# Patient Record
Sex: Female | Born: 1940 | Race: Black or African American | Hispanic: No | Marital: Married | Smoking: Former smoker
Health system: Southern US, Community
[De-identification: ages and names within clinical notes are randomized; demographics above are authoritative.]

## PROBLEM LIST (undated history)

## (undated) DIAGNOSIS — M1990 Unspecified osteoarthritis, unspecified site: Secondary | ICD-10-CM

## (undated) DIAGNOSIS — J42 Unspecified chronic bronchitis: Secondary | ICD-10-CM

## (undated) DIAGNOSIS — I10 Essential (primary) hypertension: Secondary | ICD-10-CM

## (undated) DIAGNOSIS — Z972 Presence of dental prosthetic device (complete) (partial): Secondary | ICD-10-CM

## (undated) DIAGNOSIS — N289 Disorder of kidney and ureter, unspecified: Secondary | ICD-10-CM

## (undated) DIAGNOSIS — M810 Age-related osteoporosis without current pathological fracture: Secondary | ICD-10-CM

## (undated) DIAGNOSIS — J449 Chronic obstructive pulmonary disease, unspecified: Secondary | ICD-10-CM

## (undated) DIAGNOSIS — IMO0001 Reserved for inherently not codable concepts without codable children: Secondary | ICD-10-CM

## (undated) DIAGNOSIS — K219 Gastro-esophageal reflux disease without esophagitis: Secondary | ICD-10-CM

## (undated) DIAGNOSIS — K5790 Diverticulosis of intestine, part unspecified, without perforation or abscess without bleeding: Secondary | ICD-10-CM

---

## 2007-03-13 ENCOUNTER — Ambulatory Visit: Payer: Self-pay | Admitting: Internal Medicine

## 2007-06-14 ENCOUNTER — Ambulatory Visit: Payer: Self-pay | Admitting: Family Medicine

## 2007-06-17 ENCOUNTER — Ambulatory Visit: Payer: Self-pay | Admitting: Internal Medicine

## 2008-10-30 ENCOUNTER — Emergency Department: Payer: Self-pay | Admitting: Emergency Medicine

## 2009-02-28 ENCOUNTER — Ambulatory Visit: Payer: Self-pay | Admitting: Family Medicine

## 2009-03-23 ENCOUNTER — Inpatient Hospital Stay: Payer: Self-pay | Admitting: Student

## 2010-05-03 ENCOUNTER — Ambulatory Visit: Payer: Self-pay | Admitting: Family Medicine

## 2011-05-04 ENCOUNTER — Ambulatory Visit: Payer: Self-pay | Admitting: Family Medicine

## 2011-05-16 DIAGNOSIS — I10 Essential (primary) hypertension: Secondary | ICD-10-CM | POA: Insufficient documentation

## 2012-02-26 ENCOUNTER — Ambulatory Visit: Payer: Self-pay | Admitting: Gastroenterology

## 2012-02-27 LAB — PATHOLOGY REPORT

## 2012-05-06 ENCOUNTER — Ambulatory Visit: Payer: Self-pay | Admitting: Family Medicine

## 2013-04-13 ENCOUNTER — Ambulatory Visit: Payer: Self-pay | Admitting: Family Medicine

## 2013-05-07 ENCOUNTER — Ambulatory Visit: Payer: Self-pay | Admitting: Family Medicine

## 2013-08-31 DIAGNOSIS — E559 Vitamin D deficiency, unspecified: Secondary | ICD-10-CM | POA: Insufficient documentation

## 2013-11-27 DIAGNOSIS — E7800 Pure hypercholesterolemia, unspecified: Secondary | ICD-10-CM | POA: Insufficient documentation

## 2014-03-22 DIAGNOSIS — J449 Chronic obstructive pulmonary disease, unspecified: Secondary | ICD-10-CM | POA: Insufficient documentation

## 2014-05-12 ENCOUNTER — Ambulatory Visit
Admit: 2014-05-12 | Disposition: A | Discharge status: Home / Self Care | Payer: Self-pay | Attending: Family Medicine | Admitting: Family Medicine

## 2014-05-27 ENCOUNTER — Encounter: Payer: Self-pay | Admitting: *Deleted

## 2014-06-02 ENCOUNTER — Ambulatory Visit: Payer: Medicare HMO | Admitting: Anesthesiology

## 2014-06-02 ENCOUNTER — Encounter
Admission: RE | Disposition: A | Discharge status: Home / Self Care | Payer: Self-pay | Source: Ambulatory Visit | Attending: Ophthalmology

## 2014-06-02 ENCOUNTER — Ambulatory Visit
Admission: RE | Admit: 2014-06-02 | Discharge: 2014-06-02 | Disposition: A | Discharge status: Home / Self Care | Payer: Medicare HMO | Source: Ambulatory Visit | Attending: Ophthalmology | Admitting: Ophthalmology

## 2014-06-02 ENCOUNTER — Encounter: Payer: Self-pay | Admitting: Anesthesiology

## 2014-06-02 DIAGNOSIS — K219 Gastro-esophageal reflux disease without esophagitis: Secondary | ICD-10-CM | POA: Insufficient documentation

## 2014-06-02 DIAGNOSIS — K5790 Diverticulosis of intestine, part unspecified, without perforation or abscess without bleeding: Secondary | ICD-10-CM | POA: Diagnosis not present

## 2014-06-02 DIAGNOSIS — Z87891 Personal history of nicotine dependence: Secondary | ICD-10-CM | POA: Diagnosis not present

## 2014-06-02 DIAGNOSIS — M810 Age-related osteoporosis without current pathological fracture: Secondary | ICD-10-CM | POA: Diagnosis not present

## 2014-06-02 DIAGNOSIS — J449 Chronic obstructive pulmonary disease, unspecified: Secondary | ICD-10-CM | POA: Insufficient documentation

## 2014-06-02 DIAGNOSIS — I10 Essential (primary) hypertension: Secondary | ICD-10-CM | POA: Diagnosis not present

## 2014-06-02 DIAGNOSIS — N289 Disorder of kidney and ureter, unspecified: Secondary | ICD-10-CM | POA: Insufficient documentation

## 2014-06-02 DIAGNOSIS — M1990 Unspecified osteoarthritis, unspecified site: Secondary | ICD-10-CM | POA: Insufficient documentation

## 2014-06-02 DIAGNOSIS — M7989 Other specified soft tissue disorders: Secondary | ICD-10-CM | POA: Insufficient documentation

## 2014-06-02 DIAGNOSIS — Z7951 Long term (current) use of inhaled steroids: Secondary | ICD-10-CM | POA: Insufficient documentation

## 2014-06-02 DIAGNOSIS — R062 Wheezing: Secondary | ICD-10-CM | POA: Diagnosis not present

## 2014-06-02 DIAGNOSIS — H2512 Age-related nuclear cataract, left eye: Secondary | ICD-10-CM | POA: Diagnosis present

## 2014-06-02 HISTORY — DX: Presence of dental prosthetic device (complete) (partial): Z97.2

## 2014-06-02 HISTORY — DX: Gastro-esophageal reflux disease without esophagitis: K21.9

## 2014-06-02 HISTORY — DX: Unspecified osteoarthritis, unspecified site: M19.90

## 2014-06-02 HISTORY — DX: Diverticulosis of intestine, part unspecified, without perforation or abscess without bleeding: K57.90

## 2014-06-02 HISTORY — DX: Age-related osteoporosis without current pathological fracture: M81.0

## 2014-06-02 HISTORY — DX: Chronic obstructive pulmonary disease, unspecified: J44.9

## 2014-06-02 HISTORY — DX: Reserved for inherently not codable concepts without codable children: IMO0001

## 2014-06-02 HISTORY — DX: Disorder of kidney and ureter, unspecified: N28.9

## 2014-06-02 HISTORY — PX: CATARACT EXTRACTION W/PHACO: SHX586

## 2014-06-02 SURGERY — PHACOEMULSIFICATION, CATARACT, WITH IOL INSERTION
Anesthesia: Monitor Anesthesia Care | Laterality: Left | Wound class: Clean

## 2014-06-02 MED ORDER — ACETAMINOPHEN 160 MG/5ML PO SOLN: 325.0000 mg | ORAL | Status: DC | PRN

## 2014-06-02 MED ORDER — ACETAMINOPHEN 325 MG PO TABS: 325.0000 mg | ORAL_TABLET | ORAL | Status: DC | PRN

## 2014-06-02 MED ORDER — EPINEPHRINE HCL 1 MG/ML IJ SOLN
INTRAMUSCULAR | Status: DC | PRN
  Administered 2014-06-02: 1 mL

## 2014-06-02 MED ORDER — TETRACAINE HCL 0.5 % OP SOLN
1.0000 [drp] | Freq: Once | OPHTHALMIC | Status: AC
  Administered 2014-06-02: 1 [drp] via OPHTHALMIC

## 2014-06-02 MED ORDER — MIDAZOLAM HCL 2 MG/2ML IJ SOLN
INTRAMUSCULAR | Status: DC | PRN
  Administered 2014-06-02: 2 mg via INTRAVENOUS

## 2014-06-02 MED ORDER — NA HYALUR & NA CHOND-NA HYALUR 0.4-0.35 ML IO KIT
PACK | INTRAOCULAR | Status: DC | PRN
  Administered 2014-06-02: 1 mL via INTRAOCULAR

## 2014-06-02 MED ORDER — POVIDONE-IODINE 5 % OP SOLN
1.0000 | Freq: Once | OPHTHALMIC | Status: AC
Start: 2014-06-02 — End: 2014-06-02
  Administered 2014-06-02: 1 via OPHTHALMIC

## 2014-06-02 MED ORDER — BSS IO SOLN
INTRAOCULAR | Status: DC | PRN
  Administered 2014-06-02: 88 mL via INTRAOCULAR

## 2014-06-02 MED ORDER — FENTANYL CITRATE (PF) 100 MCG/2ML IJ SOLN
INTRAMUSCULAR | Status: DC | PRN
  Administered 2014-06-02: 50 ug via INTRAVENOUS

## 2014-06-02 MED ORDER — ARMC OPHTHALMIC DILATING GEL
1.0000 "application " | OPHTHALMIC | Status: DC | PRN
  Administered 2014-06-02 (×2): 1 via OPHTHALMIC

## 2014-06-02 MED ORDER — BRIMONIDINE TARTRATE 0.2 % OP SOLN
OPHTHALMIC | Status: DC | PRN
  Administered 2014-06-02: 3 [drp] via OPHTHALMIC

## 2014-06-02 MED ORDER — CEFUROXIME OPHTHALMIC INJECTION 1 MG/0.1 ML
INJECTION | OPHTHALMIC | Status: DC | PRN
  Administered 2014-06-02: .3 mL via OPHTHALMIC

## 2014-06-02 MED ORDER — TIMOLOL MALEATE 0.5 % OP SOLN
OPHTHALMIC | Status: DC | PRN
  Administered 2014-06-02: 3 [drp] via OPHTHALMIC

## 2014-06-02 MED ORDER — BSS IO SOLN
INTRAOCULAR | Status: DC | PRN
  Administered 2014-06-02: 15 mL

## 2014-06-02 SURGICAL SUPPLY — 25 items
CANNULA ANT/CHMB 27GA (MISCELLANEOUS) ×3 IMPLANT
GLOVE SURG LX 7.5 STRW (GLOVE) ×2
GLOVE SURG LX STRL 7.5 STRW (GLOVE) ×1 IMPLANT
GLOVE SURG TRIUMPH 8.0 PF LTX (GLOVE) ×3 IMPLANT
GOWN STRL REUS W/ TWL LRG LVL3 (GOWN DISPOSABLE) ×2 IMPLANT
GOWN STRL REUS W/TWL LRG LVL3 (GOWN DISPOSABLE) ×4
LENS IOL TECNIS 20.5 (Intraocular Lens) ×3 IMPLANT
LENS IOL TECNIS MONO 1P 20.5 (Intraocular Lens) ×1 IMPLANT
MARKER SKIN SURG W/RULER VIO (MISCELLANEOUS) ×3 IMPLANT
NDL RETROBULBAR .5 NSTRL (NEEDLE) IMPLANT
NEEDLE FILTER BLUNT 18X 1/2SAF (NEEDLE) ×2
NEEDLE FILTER BLUNT 18X1 1/2 (NEEDLE) ×1 IMPLANT
PACK CATARACT BRASINGTON (MISCELLANEOUS) ×3 IMPLANT
PACK EYE AFTER SURG (MISCELLANEOUS) ×3 IMPLANT
PACK OPTHALMIC (MISCELLANEOUS) ×3 IMPLANT
RING MALYGIN 7.0 (MISCELLANEOUS) IMPLANT
SUT ETHILON 10-0 CS-B-6CS-B-6 (SUTURE)
SUT VICRYL  9 0 (SUTURE)
SUT VICRYL 9 0 (SUTURE) IMPLANT
SUTURE EHLN 10-0 CS-B-6CS-B-6 (SUTURE) IMPLANT
SYR 3ML LL SCALE MARK (SYRINGE) ×3 IMPLANT
SYR 5ML LL (SYRINGE) IMPLANT
SYR TB 1ML LUER SLIP (SYRINGE) ×3 IMPLANT
WATER STERILE IRR 500ML POUR (IV SOLUTION) ×3 IMPLANT
WIPE NON LINTING 3.25X3.25 (MISCELLANEOUS) ×3 IMPLANT

## 2014-06-02 NOTE — Op Note (Signed)
OPERATIVE NOTE  Katie Conway [id number] 06/02/2014   PREOPERATIVE DIAGNOSIS:   Nuclear sclerotic cataract left eye. H25.12   POSTOPERATIVE DIAGNOSIS:      Nuclear sclerotic cataract left eye.     PROCEDURE:  Phacoemusification with posterior chamber intraocular lens placement of the left eye   LENS:   Implant Name Type Inv. Item Serial No. Manufacturer Lot No. LRB No. Used  LENS IMPL INTRAOC ZCB00 20.5 - [id number] Intraocular Lens LENS IMPL INTRAOC ZCB00 20.5 [id number]   Left 1       ULTRASOUND TIME: 12 of 1 minutes 26 seconds, CDE 10.3  SURGEON:  Wyonia Hough, MD   ANESTHESIA:  Topical with tetracaine drops and 2% Xylocaine jelly.   COMPLICATIONS:  None.   DESCRIPTION OF PROCEDURE:  The patient was identified in the holding room and transported to the operating room and placed in the supine position under the operating microscope.  The left eye was identified as the operative eye and it was prepped and draped in the usual sterile ophthalmic fashion.   A 1 millimeter clear-corneal paracentesis was made at the 1:30 position.  The anterior chamber was filled with Viscoat viscoelastic.  A 2.4 millimeter keratome was used to make a near-clear corneal incision at the 10:30 position.  .  A curvilinear capsulorrhexis was made with a cystotome and capsulorrhexis forceps.  Balanced salt solution was used to hydrodissect and hydrodelineate the nucleus.   Phacoemulsification was then used in stop and chop fashion to remove the lens nucleus and epinucleus.  The remaining cortex was then removed using the irrigation and aspiration handpiece. Provisc was then placed into the capsular bag to distend it for lens placement.  A 20.5 -diopter lens was then injected into the capsular bag.  The remaining viscoelastic was aspirated.   Wounds were hydrated with balanced salt solution.  The anterior chamber was inflated to a physiologic pressure with balanced salt solution.  No wound  leaks were noted. Cefuroxime 0.1 ml of a 10mg /ml solution was injected into the anterior chamber for a dose of 1 mg of intracameral antibiotic at the completion of the case.   Timolol and Brimonidine drops were applied to the eye.  The patient was taken to the recovery room in stable condition without complications of anesthesia or surgery.  Katie Conway 06/02/2014, 9:06 AM

## 2014-06-02 NOTE — Anesthesia Preprocedure Evaluation (Signed)
Anesthesia Evaluation  Patient identified by MRN, date of birth, ID band Patient awake    Reviewed: Allergy & Precautions, H&P , Patient's Chart, lab work & pertinent test results  History of Anesthesia Complications Negative for: history of anesthetic complications  Airway Mallampati: II  TM Distance: >3 FB Neck ROM: full    Dental no notable dental hx.    Pulmonary COPD COPD inhaler, former smoker,    Pulmonary exam normal       Cardiovascular hypertension, Normal cardiovascular exam    Neuro/Psych    GI/Hepatic Neg liver ROS, GERD-  Medicated,  Endo/Other  negative endocrine ROS  Renal/GU Renal disease     Musculoskeletal   Abdominal   Peds  Hematology negative hematology ROS (+)   Anesthesia Other Findings   Reproductive/Obstetrics                             Anesthesia Physical Anesthesia Plan  ASA: II  Anesthesia Plan: MAC   Post-op Pain Management:    Induction:   Airway Management Planned:   Additional Equipment:   Intra-op Plan:   Post-operative Plan:   Informed Consent: I have reviewed the patients History and Physical, chart, labs and discussed the procedure including the risks, benefits and alternatives for the proposed anesthesia with the patient or authorized representative who has indicated his/her understanding and acceptance.     Plan Discussed with: CRNA  Anesthesia Plan Comments:         Anesthesia Quick Evaluation

## 2014-06-02 NOTE — Transfer of Care (Signed)
Immediate Anesthesia Transfer of Care Note  Patient: Katie Conway  Procedure(s) Performed: Procedure(s): CATARACT EXTRACTION PHACO AND INTRAOCULAR LENS PLACEMENT (IOC) (Left)  Patient Location: PACU  Anesthesia Type: MAC  Level of Consciousness: awake, alert  and patient cooperative  Airway and Oxygen Therapy: Patient Spontanous Breathing and Patient connected to supplemental oxygen  Post-op Assessment: Post-op Vital signs reviewed, Patient's Cardiovascular Status Stable, Respiratory Function Stable, Patent Airway and No signs of Nausea or vomiting  Post-op Vital Signs: Reviewed and stable  Complications: No apparent anesthesia complications

## 2014-06-02 NOTE — Anesthesia Postprocedure Evaluation (Signed)
  Anesthesia Post-op Note  Patient: Katie Conway  Procedure(s) Performed: Procedure(s): CATARACT EXTRACTION PHACO AND INTRAOCULAR LENS PLACEMENT (IOC) (Left)  Anesthesia type:MAC  Patient location: PACU  Post pain: Pain level controlled  Post assessment: Post-op Vital signs reviewed, Patient's Cardiovascular Status Stable, Respiratory Function Stable, Patent Airway and No signs of Nausea or vomiting  Post vital signs: Reviewed and stable  Last Vitals:  Filed Vitals:   06/02/14 0908  BP: 136/79  Pulse: 68  Temp:   Resp: 20    Level of consciousness: awake, alert  and patient cooperative  Complications: No apparent anesthesia complications

## 2014-06-02 NOTE — H&P (Signed)
  The History and Physical notes were scanned in.  The patient remains stable and unchanged from the H&P.   Previous H&P reviewed, patient examined, and there are no changes.  Vonnie Spagnolo 06/02/2014 8:14 AM

## 2014-06-02 NOTE — Discharge Instructions (Signed)
Follow-Up Appointment is: May 12 @ 10:15 am   Cataract Surgery Care After Refer to this sheet in the next few weeks. These instructions provide you with information on caring for yourself after your procedure. Your caregiver may also give you more specific instructions. Your treatment has been planned according to current medical practices, but problems sometimes occur. Call your caregiver if you have any problems or questions after your procedure.  HOME CARE INSTRUCTIONS   Avoid strenuous activities as directed by your caregiver.  Ask your caregiver when you can resume driving.  Use eyedrops or other medicines to help healing and control pressure inside your eye as directed by your caregiver.  Only take over-the-counter or prescription medicines for pain, discomfort, or fever as directed by your caregiver.  Do not to touch or rub your eyes.  You may be instructed to use a protective shield during the first few days and nights after surgery. If not, wear sunglasses to protect your eyes. This is to protect the eye from pressure or from being accidentally bumped.  Keep the area around your eye clean and dry. Avoid swimming or allowing water to hit you directly in the face while showering. Keep soap and shampoo out of your eyes.  Do not bend or lift heavy objects. Bending increases pressure in the eye. You can walk, climb stairs, and do light household chores.  Do not put a contact lens into the eye that had surgery until your caregiver says it is okay to do so.  Ask your doctor when you can return to work. This will depend on the kind of work that you do. If you work in a dusty environment, you may be advised to wear protective eyewear for a period of time.  Ask your caregiver when it will be safe to engage in sexual activity.  Continue with your regular eye exams as directed by your caregiver. What to expect:  It is normal to feel itching and mild discomfort for a few days after  cataract surgery. Some fluid discharge is also common, and your eye may be sensitive to light and touch.  After 1 to 2 days, even moderate discomfort should disappear. In most cases, healing will take about 6 weeks.  If you received an intraocular lens (IOL), you may notice that colors are very bright or have a blue tinge. Also, if you have been in bright sunlight, everything may appear reddish for a few hours. If you see these color tinges, it is because your lens is clear and no longer cloudy. Within a few months after receiving an IOL, these extra colors should go away. When you have healed, you will probably need new glasses. SEEK MEDICAL CARE IF:   You have increased bruising around your eye.  You have discomfort not helped by medicine. SEEK IMMEDIATE MEDICAL CARE IF:   You have a fever.  You have a worsening or sudden vision loss.  You have redness, swelling, or increasing pain in the eye.  You have a thick discharge from the eye that had surgery. MAKE SURE YOU:  Understand these instructions.  Will watch your condition.  Will get help right away if you are not doing well or get worse. Document Released: 07/28/2004 Document Revised: 04/02/2011 Document Reviewed: 09/01/2010 [facility] Patient Information 2015 [place], [place]. This information is not intended to replace advice given to you by your health care provider. Make sure you discuss any questions you have with your health care provider.   General  Anesthesia, Care After Refer to this sheet in the next few weeks. These instructions provide you with information on caring for yourself after your procedure. Your health care provider may also give you more specific instructions. Your treatment has been planned according to current medical practices, but problems sometimes occur. Call your health care provider if you have any problems or questions after your procedure. WHAT TO EXPECT AFTER THE PROCEDURE After the procedure, it is  typical to experience:  Sleepiness.  Nausea and vomiting. HOME CARE INSTRUCTIONS  For the first 24 hours after general anesthesia:  Have a responsible person with you.  Do not drive a car. If you are alone, do not take public transportation.  Do not drink alcohol.  Do not take medicine that has not been prescribed by your health care provider.  Do not sign important papers or make important decisions.  You may resume a normal diet and activities as directed by your health care provider.  Change bandages (dressings) as directed.  If you have questions or problems that seem related to general anesthesia, call the hospital and ask for the anesthetist or anesthesiologist on call. SEEK MEDICAL CARE IF:  You have nausea and vomiting that continue the day after anesthesia.  You develop a rash. SEEK IMMEDIATE MEDICAL CARE IF:   You have difficulty breathing.  You have chest pain.  You have any allergic problems. Document Released: 04/16/2000 Document Revised: 01/13/2013 Document Reviewed: 07/24/2012 [facility] Patient Information 2015 [place], [place]. This information is not intended to replace advice given to you by your health care provider. Make sure you discuss any questions you have with your health care provider.

## 2014-06-04 ENCOUNTER — Encounter: Payer: Self-pay | Admitting: Ophthalmology

## 2014-06-08 DIAGNOSIS — H269 Unspecified cataract: Secondary | ICD-10-CM | POA: Insufficient documentation

## 2014-06-10 ENCOUNTER — Encounter: Payer: Self-pay | Admitting: Ophthalmology

## 2014-09-22 ENCOUNTER — Inpatient Hospital Stay: Payer: Managed Care, Other (non HMO) | Attending: Hematology and Oncology | Admitting: Hematology and Oncology

## 2014-09-22 ENCOUNTER — Encounter: Payer: Self-pay | Admitting: Hematology and Oncology

## 2014-09-22 ENCOUNTER — Inpatient Hospital Stay: Payer: Managed Care, Other (non HMO)

## 2014-09-22 VITALS — BP 126/69 | HR 78 | Temp 97.0°F | Resp 16 | Ht 58.86 in | Wt 144.7 lb

## 2014-09-22 DIAGNOSIS — K219 Gastro-esophageal reflux disease without esophagitis: Secondary | ICD-10-CM | POA: Diagnosis not present

## 2014-09-22 DIAGNOSIS — N289 Disorder of kidney and ureter, unspecified: Secondary | ICD-10-CM

## 2014-09-22 DIAGNOSIS — M818 Other osteoporosis without current pathological fracture: Secondary | ICD-10-CM | POA: Insufficient documentation

## 2014-09-22 DIAGNOSIS — Z87891 Personal history of nicotine dependence: Secondary | ICD-10-CM | POA: Diagnosis not present

## 2014-09-22 DIAGNOSIS — M129 Arthropathy, unspecified: Secondary | ICD-10-CM | POA: Insufficient documentation

## 2014-09-22 DIAGNOSIS — Z79899 Other long term (current) drug therapy: Secondary | ICD-10-CM | POA: Insufficient documentation

## 2014-09-22 DIAGNOSIS — J449 Chronic obstructive pulmonary disease, unspecified: Secondary | ICD-10-CM | POA: Diagnosis not present

## 2014-09-22 DIAGNOSIS — Z7952 Long term (current) use of systemic steroids: Secondary | ICD-10-CM | POA: Insufficient documentation

## 2014-09-22 DIAGNOSIS — K5790 Diverticulosis of intestine, part unspecified, without perforation or abscess without bleeding: Secondary | ICD-10-CM | POA: Diagnosis not present

## 2014-09-22 DIAGNOSIS — D72829 Elevated white blood cell count, unspecified: Secondary | ICD-10-CM | POA: Diagnosis present

## 2014-09-22 DIAGNOSIS — I10 Essential (primary) hypertension: Secondary | ICD-10-CM | POA: Insufficient documentation

## 2014-09-22 DIAGNOSIS — R05 Cough: Secondary | ICD-10-CM | POA: Diagnosis not present

## 2014-09-22 LAB — CBC WITH DIFFERENTIAL/PLATELET
Basophils Absolute: 0.1 10*3/uL (ref 0–0.1)
Basophils Relative: 1 %
Eosinophils Absolute: 0.1 10*3/uL (ref 0–0.7)
Eosinophils Relative: 1 %
HCT: 36 % (ref 35.0–47.0)
Hemoglobin: 11.7 g/dL — ABNORMAL LOW (ref 12.0–16.0)
Lymphocytes Relative: 12 %
Lymphs Abs: 1.4 10*3/uL (ref 1.0–3.6)
MCH: 26.2 pg (ref 26.0–34.0)
MCHC: 32.6 g/dL (ref 32.0–36.0)
MCV: 80.4 fL (ref 80.0–100.0)
Monocytes Absolute: 0.5 10*3/uL (ref 0.2–0.9)
Monocytes Relative: 4 %
Neutro Abs: 9.8 10*3/uL — ABNORMAL HIGH (ref 1.4–6.5)
Neutrophils Relative %: 82 %
Platelets: 269 10*3/uL (ref 150–440)
RBC: 4.48 MIL/uL (ref 3.80–5.20)
RDW: 13.8 % (ref 11.5–14.5)
WBC: 11.9 10*3/uL — ABNORMAL HIGH (ref 3.6–11.0)

## 2014-09-22 LAB — SEDIMENTATION RATE: Sed Rate: 55 mm/hr — ABNORMAL HIGH (ref 0–30)

## 2014-09-22 NOTE — Progress Notes (Signed)
[facility] day:  09/22/2014  Chief Complaint: Katie Conway is a 74 y.o. female with leukocytosis who is referred in consultation by Dr. Kandice Robinsons.  HPI:  The patient notes a history of COPD. She smoked 1 pack a day for 50 years. She stopped smoking about 3 years ago.  She states that she had a chest x-ray this year that was "fine". She is followed by Dr. Raul Del, pulmonologist. She notes periodic cough and sinus infections.  She has a sinus infections about 2-3 times a year. Infections are worse during allergy season. She notes phlegm in her throat. She denies any other infections.  She notes being on steroids. She takes prednisone 10 mg a day secondary to her "breathing". She is also on aerosolized steroids. She denies steroid injections.  Labs from 1 year ago include a hematocrit 34.1, hemoglobin 11.1, MCV 81.6, platelets 279,000, white count 8800 with an [place] of 5700.  Subsequent white blood cell counts have ranged between 10,800 and 12,300 without trend. Differential has been predominantly neutrophils. Hematocrit has ranged between 35.6 and 36.82. Ferritin was 154 and 08/26/2014. Basic metabolic panel has included a creatinine between 1.7 and 1.9.  Symptomatically, the patient feels fine. She denies any pain. She has a chronic dry cough.   Past Medical History  Diagnosis Date  . COPD (chronic obstructive pulmonary disease)   . Renal insufficiency   . Arthritis     Knees  . Wears dentures     Full - upper.  Partial - Lower  . GERD (gastroesophageal reflux disease)   . Diverticulosis   . Shortness of breath dyspnea     with exertion  . Osteoporosis     Past Surgical History  Procedure Laterality Date  . Cesarean section    . Cataract extraction w/phaco Left 06/02/2014    Procedure: CATARACT EXTRACTION PHACO AND INTRAOCULAR LENS PLACEMENT (IOC);  Surgeon: Leandrew Koyanagi, MD;  Location: [facility];  Service: Ophthalmology;   Laterality: Left;    Family History  Problem Relation Age of Onset  . Heart failure Mother   . Heart attack Father   . COPD Brother   . Kidney failure Brother     Social History:  reports that she quit smoking about 3 years ago. Her smoking use included Cigarettes. She does not have any smokeless tobacco history on file. She reports that she does not drink alcohol. Her drug history is not on file.  The patient is alone today.  Allergies:  Allergies  Allergen Reactions  . Aspirin Other (See Comments)    GI Upset  . Amlodipine Rash and Other (See Comments)    Fatigue Fatigue    Current Medications: Current Outpatient Prescriptions  Medication Sig Dispense Refill  . albuterol (PROVENTIL HFA;VENTOLIN HFA) 108 (90 BASE) MCG/ACT inhaler Inhale into the lungs every 6 (six) hours as needed for wheezing or shortness of breath.    . Calcium-Vitamin D (CALTRATE 600 PLUS-VIT D PO) Take by mouth daily. AM    . hydrochlorothiazide (HYDRODIURIL) 25 MG tablet Take 25 mg by mouth daily. AM    . ipratropium (ATROVENT HFA) 17 MCG/ACT inhaler Inhale 2 puffs into the lungs every 6 (six) hours.    . metoprolol succinate (TOPROL-XL) 25 MG 24 hr tablet Take 25 mg by mouth.    . metoprolol tartrate (LOPRESSOR) 25 MG tablet Take 25 mg by mouth daily. AM    . Omega-3 Fatty Acids (OMEGA-3 FISH OIL) 300  MG CAPS Take 1 capsule by mouth daily.    . predniSONE (DELTASONE) 5 MG tablet Take 10 mg by mouth daily with breakfast.     . ranitidine (ZANTAC) 150 MG tablet Take 150 mg by mouth 2 (two) times daily. AM    . vitamin B-12 (CYANOCOBALAMIN) 500 MCG tablet Take 1,000 mcg by mouth daily. AM    . Calcium Carbonate (CALTRATE 600 PO) Take by mouth.    Marland Kitchen ipratropium-albuterol (DUONEB) 0.5-2.5 (3) MG/3ML SOLN Take 3 mLs by nebulization every 4 (four) hours as needed. Uses 4-6 times per day    . Multiple Vitamins-Minerals (HAIR/SKIN/NAILS PO) Take by mouth daily. AM    . nicotine polacrilex (COMMIT) 4 MG lozenge  Take 4 mg by mouth as needed for smoking cessation. Uses 1/2 tab, 6-8 times per day     No current facility-administered medications for this visit.    Review of Systems:  GENERAL:  Feels fine.  No fevers, sweats or weight loss.  Weight gain of 18 pounds in 4-5 years PERFORMANCE STATUS (ECOG):  1 HEENT:  No visual changes, runny nose, sore throat, mouth sores or tenderness. Lungs: Dry cough.  AM phlegm.  No shortness of breath.  No hemoptysis. Cardiac:  No chest pain, palpitations, orthopnea, or PND. Breasts:  Denies any masses or skin changes.  Mammogram 02/2013 "ok". GI:  No nausea, vomiting, diarrhea, constipation, melena or hematochezia.  Colonoscopy 1 year ago ("fine"). GU:  No urgency, frequency, dysuria, or hematuria. Musculoskeletal:  No back pain.  No joint pain.  No muscle tenderness. Extremities:  No pain or swelling. Skin:  No rashes or skin changes. Neuro:  No headache, numbness or weakness, balance or coordination issues. Endocrine:  No diabetes, thyroid issues, hot flashes or night sweats. Psych:  No mood changes, depression or anxiety. Pain:  No focal pain. Review of systems:  All other systems reviewed and found to be negative.  Physical Exam: Blood pressure 126/69, pulse 78, temperature 97 F (36.1 C), temperature source Tympanic, resp. rate 16, height 4' 10.86" (1.495 m), weight 144 lb 11.7 oz (65.65 kg), SpO2 99 %. GENERAL:  Well developed, well nourished, sitting comfortably in the exam room under a blanket in no acute distress. MENTAL STATUS:  Alert and oriented to person, place and time. HEAD:  Pearline Cables hair.  Normocephalic, atraumatic, face symmetric, no Cushingoid features. EYES: Glasses.  Brown eyes.  Pupils equal round and reactive to light and accomodation.  No conjunctivitis or scleral icterus. ENT:  Oropharynx clear without lesion.  Tongue normal. Mucous membranes moist.  RESPIRATORY:  Soft wheeze.  Otherwise, clear to auscultation without rales or  rhonchi. CARDIOVASCULAR:  Regular rate and rhythm without murmur, rub or gallop. ABDOMEN:  Soft, non-tender, with active bowel sounds, and no hepatosplenomegaly.  No masses. SKIN:  No rashes, ulcers or lesions. EXTREMITIES: No edema, no skin discoloration or tenderness.  No palpable cords. LYMPH NODES: No palpable cervical, supraclavicular, axillary or inguinal adenopathy  NEUROLOGICAL: Unremarkable. PSYCH:  Appropriate.  Office Visit on 09/22/2014  Component Date Value Ref Range Status  . WBC 09/22/2014 11.9* 3.6 - 11.0 K/uL Final  . RBC 09/22/2014 4.48  3.80 - 5.20 MIL/uL Final  . Hemoglobin 09/22/2014 11.7* 12.0 - 16.0 g/dL Final  . HCT 09/22/2014 36.0  35.0 - 47.0 % Final  . MCV 09/22/2014 80.4  80.0 - 100.0 fL Final  . MCH 09/22/2014 26.2  26.0 - 34.0 pg Final  . MCHC 09/22/2014 32.6  32.0 - 36.0 g/dL  Final  . RDW 09/22/2014 13.8  11.5 - 14.5 % Final  . Platelets 09/22/2014 269  150 - 440 K/uL Final  . Neutrophils Relative % 09/22/2014 82   Final  . Neutro Abs 09/22/2014 9.8* 1.4 - 6.5 K/uL Final  . Lymphocytes Relative 09/22/2014 12   Final  . Lymphs Abs 09/22/2014 1.4  1.0 - 3.6 K/uL Final  . Monocytes Relative 09/22/2014 4   Final  . Monocytes Absolute 09/22/2014 0.5  0.2 - 0.9 K/uL Final  . Eosinophils Relative 09/22/2014 1   Final  . Eosinophils Absolute 09/22/2014 0.1  0 - 0.7 K/uL Final  . Basophils Relative 09/22/2014 1   Final  . Basophils Absolute 09/22/2014 0.1  0 - 0.1 K/uL Final  . Sed Rate 09/22/2014 55* 0 - 30 mm/hr Final    Assessment:  Katie Conway is a 74 y.o. female a history of COPD and renal insufficiency with a one-year history of mild leukocytosis. WBCs have ranged between 10,800 and 12,300 without trend. Differential has been predominantly neutrophils. Hematocrit has ranged between 35.6 and 36.82. Ferritin was 154 and 08/26/2014. Creatinine has ranged between 1.7 and 1.9.  She has had issues with a chronic sinus infections.  She is on low dose  steroids.  The etiology of her leukocytosis is likely secondary to inflammation and demargination of WBCs due to steroids.  Symptomatically, she feels fine. She denies any B symptoms. Exam reveals some soft wheezes.  Plan: 1. Discuss likely etiology of leukocytosis is reactive in nature.  Review peripheral smear.  2. Labs today:  CBC with diff, ESR. 3. Discuss consideration low dose spiral chest CT secondary to history of significant tobacco use. 4. Discuss need for annual mammogram. 5. RTC after above.   Lequita Asal, MD  09/22/2014, 10:46 AM

## 2014-09-29 ENCOUNTER — Inpatient Hospital Stay: Payer: Medicare HMO | Attending: Hematology and Oncology | Admitting: Hematology and Oncology

## 2014-09-29 VITALS — BP 160/77 | HR 80 | Temp 97.0°F | Resp 16 | Wt 147.0 lb

## 2014-09-29 DIAGNOSIS — Z79899 Other long term (current) drug therapy: Secondary | ICD-10-CM | POA: Diagnosis not present

## 2014-09-29 DIAGNOSIS — N289 Disorder of kidney and ureter, unspecified: Secondary | ICD-10-CM | POA: Diagnosis not present

## 2014-09-29 DIAGNOSIS — M129 Arthropathy, unspecified: Secondary | ICD-10-CM | POA: Diagnosis not present

## 2014-09-29 DIAGNOSIS — R0602 Shortness of breath: Secondary | ICD-10-CM | POA: Insufficient documentation

## 2014-09-29 DIAGNOSIS — Z7952 Long term (current) use of systemic steroids: Secondary | ICD-10-CM | POA: Diagnosis not present

## 2014-09-29 DIAGNOSIS — K5790 Diverticulosis of intestine, part unspecified, without perforation or abscess without bleeding: Secondary | ICD-10-CM | POA: Diagnosis not present

## 2014-09-29 DIAGNOSIS — D72829 Elevated white blood cell count, unspecified: Secondary | ICD-10-CM

## 2014-09-29 DIAGNOSIS — R05 Cough: Secondary | ICD-10-CM | POA: Insufficient documentation

## 2014-09-29 DIAGNOSIS — M818 Other osteoporosis without current pathological fracture: Secondary | ICD-10-CM | POA: Insufficient documentation

## 2014-09-29 DIAGNOSIS — K219 Gastro-esophageal reflux disease without esophagitis: Secondary | ICD-10-CM | POA: Diagnosis not present

## 2014-09-29 DIAGNOSIS — J449 Chronic obstructive pulmonary disease, unspecified: Secondary | ICD-10-CM

## 2014-09-29 NOTE — Progress Notes (Signed)
[facility] day:  09/29/2014  Chief Complaint: Katie Conway is a 74 y.o. female with leukocytosis who is  seen for review of initial studies and discussion regarding direction of therapy.Marland Kitchen  HPI:  The patient is last seen in medical oncology clinic on 09/22/2014 for initial consultation regarding leukocytosis.  White count had ranged between 10,800 and 12,300 without trend.  Differential was predominantly neutrophils. She had a history of COPD and chronic dry cough. She noted frequent sinus infections. She was on both oral and aerosolized steroids.  Labs included a hematocrit 36, hemoglobin 11.7, MCV 80.4, platelets 269,000, white count 11,900 with an ANC of 9800.  Differential was unremarkable. Sedimentation rate was 55 (elevated).  Symptomatically, she denies any complaint.  Past Medical History  Diagnosis Date  . COPD (chronic obstructive pulmonary disease)   . Renal insufficiency   . Arthritis     Knees  . Wears dentures     Full - upper.  Partial - Lower  . GERD (gastroesophageal reflux disease)   . Diverticulosis   . Shortness of breath dyspnea     with exertion  . Osteoporosis     Past Surgical History  Procedure Laterality Date  . Cesarean section    . Cataract extraction w/phaco Left 06/02/2014    Procedure: CATARACT EXTRACTION PHACO AND INTRAOCULAR LENS PLACEMENT (IOC);  Surgeon: Leandrew Koyanagi, MD;  Location: [facility];  Service: Ophthalmology;  Laterality: Left;    Family History  Problem Relation Age of Onset  . Heart failure Mother   . Heart attack Father   . COPD Brother   . Kidney failure Brother     Social History:  reports that she quit smoking about 3 years ago. Her smoking use included Cigarettes. She does not have any smokeless tobacco history on file. She reports that she does not drink alcohol. Her drug history is not on file.  The patient is alone today.  Allergies:  Allergies   Allergen Reactions  . Aspirin Other (See Comments)    GI Upset  . Amlodipine Rash and Other (See Comments)    Fatigue Fatigue    Current Medications: Current Outpatient Prescriptions  Medication Sig Dispense Refill  . albuterol (PROVENTIL HFA;VENTOLIN HFA) 108 (90 BASE) MCG/ACT inhaler Inhale into the lungs every 6 (six) hours as needed for wheezing or shortness of breath.    . Calcium Carbonate (CALTRATE 600 PO) Take by mouth.    . Calcium-Vitamin D (CALTRATE 600 PLUS-VIT D PO) Take by mouth daily. AM    . hydrochlorothiazide (HYDRODIURIL) 25 MG tablet Take 25 mg by mouth daily. AM    . ipratropium (ATROVENT HFA) 17 MCG/ACT inhaler Inhale 2 puffs into the lungs every 6 (six) hours.    Marland Kitchen ipratropium-albuterol (DUONEB) 0.5-2.5 (3) MG/3ML SOLN Take 3 mLs by nebulization every 4 (four) hours as needed. Uses 4-6 times per day    . metoprolol succinate (TOPROL-XL) 25 MG 24 hr tablet Take 25 mg by mouth.    . metoprolol tartrate (LOPRESSOR) 25 MG tablet Take 25 mg by mouth daily. AM    . Multiple Vitamins-Minerals (HAIR/SKIN/NAILS PO) Take by mouth daily. AM    . nicotine polacrilex (COMMIT) 4 MG lozenge Take 4 mg by mouth as needed for smoking cessation. Uses 1/2 tab, 6-8 times per day    . Omega-3 Fatty Acids (OMEGA-3 FISH OIL) 300 MG CAPS Take 1 capsule by mouth daily.    . predniSONE (DELTASONE)  5 MG tablet Take 10 mg by mouth daily with breakfast.     . ranitidine (ZANTAC) 150 MG tablet Take 150 mg by mouth 2 (two) times daily. AM    . vitamin B-12 (CYANOCOBALAMIN) 500 MCG tablet Take 1,000 mcg by mouth daily. AM     No current facility-administered medications for this visit.    Review of Systems:  GENERAL:  Feels fine.  No fevers, sweats or weight loss.  Weight gain of 18 pounds in 4-5 years PERFORMANCE STATUS (ECOG):  1 HEENT:  No visual changes, runny nose, sore throat, mouth sores or tenderness. Lungs: Dry cough.  AM phlegm.  No shortness of breath.  No hemoptysis. Cardiac:   No chest pain, palpitations, orthopnea, or PND. Breasts:  Denies any masses or skin changes.  Mammogram 02/2013 "ok". GI:  No nausea, vomiting, diarrhea, constipation, melena or hematochezia.  Colonoscopy 1 year ago ("fine"). GU:  No urgency, frequency, dysuria, or hematuria. Musculoskeletal:  No back pain.  No joint pain.  No muscle tenderness. Extremities:  No pain or swelling. Skin:  No rashes or skin changes. Neuro:  No headache, numbness or weakness, balance or coordination issues. Endocrine:  No diabetes, thyroid issues, hot flashes or night sweats. Psych:  No mood changes, depression or anxiety. Pain:  No focal pain. Review of systems:  All other systems reviewed and found to be negative.  Physical Exam: Blood pressure 160/77, pulse 80, temperature 97 F (36.1 C), temperature source Tympanic, resp. rate 16, weight 147 lb 0.8 oz (66.7 kg), SpO2 99 %. GENERAL:  Well developed, well nourished, sitting comfortably in the exam room under a blanket in no acute distress. MENTAL STATUS:  Alert and oriented to person, place and time. HEAD:  Pearline Cables hair.  Normocephalic, atraumatic, face symmetric, no Cushingoid features. EYES: Glasses.  Brown eyes.  No conjunctivitis or scleral icterus. PSYCH:  Appropriate.  No visits with results within 3 Day(s) from this visit. Latest known visit with results is:  Office Visit on 09/22/2014  Component Date Value Ref Range Status  . WBC 09/22/2014 11.9* 3.6 - 11.0 K/uL Final  . RBC 09/22/2014 4.48  3.80 - 5.20 MIL/uL Final  . Hemoglobin 09/22/2014 11.7* 12.0 - 16.0 g/dL Final  . HCT 09/22/2014 36.0  35.0 - 47.0 % Final  . MCV 09/22/2014 80.4  80.0 - 100.0 fL Final  . MCH 09/22/2014 26.2  26.0 - 34.0 pg Final  . MCHC 09/22/2014 32.6  32.0 - 36.0 g/dL Final  . RDW 09/22/2014 13.8  11.5 - 14.5 % Final  . Platelets 09/22/2014 269  150 - 440 K/uL Final  . Neutrophils Relative % 09/22/2014 82   Final  . Neutro Abs 09/22/2014 9.8* 1.4 - 6.5 K/uL Final  .  Lymphocytes Relative 09/22/2014 12   Final  . Lymphs Abs 09/22/2014 1.4  1.0 - 3.6 K/uL Final  . Monocytes Relative 09/22/2014 4   Final  . Monocytes Absolute 09/22/2014 0.5  0.2 - 0.9 K/uL Final  . Eosinophils Relative 09/22/2014 1   Final  . Eosinophils Absolute 09/22/2014 0.1  0 - 0.7 K/uL Final  . Basophils Relative 09/22/2014 1   Final  . Basophils Absolute 09/22/2014 0.1  0 - 0.1 K/uL Final  . Sed Rate 09/22/2014 55* 0 - 30 mm/hr Final    Assessment:  Katie Conway is a 74 y.o. female a history of COPD and renal insufficiency with a one-year history of mild reactive leukocytosis. WBCs have ranged between 10,800 and  12,300 without trend. Differential has been predominantly neutrophils. Hematocrit has ranged between 35.6 and 36.82. Ferritin was 154 and 08/26/2014. Creatinine has ranged between 1.7 and 1.9.  She has had issues with a chronic sinus infections.  She is on low dose steroids.  Leukocytosis is secondary to inflammation and demargination of WBCs due to steroids.  ESR was 55 on 09/22/2014.  Symptomatically, she feels fine. She denies any B symptoms. Exam reveals some soft wheezes.  Plan: 1. Review labs.  Reassure patient. 2. RTC prn.   Lequita Asal, MD  09/29/2014, 11:22 PM

## 2014-10-03 ENCOUNTER — Encounter: Payer: Self-pay | Admitting: Hematology and Oncology

## 2015-02-15 NOTE — Discharge Instructions (Signed)

## 2015-02-16 ENCOUNTER — Encounter
Admission: RE | Disposition: A | Discharge status: Home / Self Care | Payer: Self-pay | Source: Ambulatory Visit | Attending: Ophthalmology

## 2015-02-16 ENCOUNTER — Ambulatory Visit: Payer: Medicare HMO | Admitting: Anesthesiology

## 2015-02-16 ENCOUNTER — Ambulatory Visit
Admission: RE | Admit: 2015-02-16 | Discharge: 2015-02-16 | Disposition: A | Discharge status: Home / Self Care | Payer: Medicare HMO | Source: Ambulatory Visit | Attending: Ophthalmology | Admitting: Ophthalmology

## 2015-02-16 DIAGNOSIS — J449 Chronic obstructive pulmonary disease, unspecified: Secondary | ICD-10-CM | POA: Insufficient documentation

## 2015-02-16 DIAGNOSIS — Z87891 Personal history of nicotine dependence: Secondary | ICD-10-CM | POA: Diagnosis not present

## 2015-02-16 DIAGNOSIS — H2511 Age-related nuclear cataract, right eye: Secondary | ICD-10-CM | POA: Insufficient documentation

## 2015-02-16 DIAGNOSIS — I10 Essential (primary) hypertension: Secondary | ICD-10-CM | POA: Insufficient documentation

## 2015-02-16 DIAGNOSIS — K219 Gastro-esophageal reflux disease without esophagitis: Secondary | ICD-10-CM | POA: Diagnosis not present

## 2015-02-16 HISTORY — PX: CATARACT EXTRACTION W/PHACO: SHX586

## 2015-02-16 HISTORY — DX: Essential (primary) hypertension: I10

## 2015-02-16 HISTORY — DX: Unspecified chronic bronchitis: J42

## 2015-02-16 SURGERY — PHACOEMULSIFICATION, CATARACT, WITH IOL INSERTION
Anesthesia: Monitor Anesthesia Care | Laterality: Right | Wound class: Clean

## 2015-02-16 MED ORDER — CEFUROXIME OPHTHALMIC INJECTION 1 MG/0.1 ML
INJECTION | OPHTHALMIC | Status: DC | PRN
  Administered 2015-02-16: 0.1 mL via INTRACAMERAL

## 2015-02-16 MED ORDER — MIDAZOLAM HCL 2 MG/2ML IJ SOLN
INTRAMUSCULAR | Status: DC | PRN
  Administered 2015-02-16: 2 mg via INTRAVENOUS

## 2015-02-16 MED ORDER — TIMOLOL MALEATE 0.5 % OP SOLN
OPHTHALMIC | Status: DC | PRN
  Administered 2015-02-16: 1 [drp] via OPHTHALMIC

## 2015-02-16 MED ORDER — OXYCODONE HCL 5 MG/5ML PO SOLN: 5.0000 mg | Freq: Once | ORAL | Status: DC | PRN

## 2015-02-16 MED ORDER — LACTATED RINGERS IV SOLN: INTRAVENOUS | Status: DC

## 2015-02-16 MED ORDER — ACETAMINOPHEN 325 MG PO TABS: 325.0000 mg | ORAL_TABLET | ORAL | Status: DC | PRN

## 2015-02-16 MED ORDER — ACETAMINOPHEN 160 MG/5ML PO SOLN: 325.0000 mg | ORAL | Status: DC | PRN

## 2015-02-16 MED ORDER — TETRACAINE HCL 0.5 % OP SOLN
1.0000 [drp] | OPHTHALMIC | Status: DC | PRN
  Administered 2015-02-16: 1 [drp] via OPHTHALMIC

## 2015-02-16 MED ORDER — NA HYALUR & NA CHOND-NA HYALUR 0.4-0.35 ML IO KIT
PACK | INTRAOCULAR | Status: DC | PRN
  Administered 2015-02-16: 1 mL via INTRAOCULAR

## 2015-02-16 MED ORDER — ARMC OPHTHALMIC DILATING GEL
1.0000 "application " | OPHTHALMIC | Status: DC | PRN
  Administered 2015-02-16 (×2): 1 via OPHTHALMIC

## 2015-02-16 MED ORDER — POVIDONE-IODINE 5 % OP SOLN
1.0000 "application " | OPHTHALMIC | Status: DC | PRN
  Administered 2015-02-16: 1 via OPHTHALMIC

## 2015-02-16 MED ORDER — DEXAMETHASONE SODIUM PHOSPHATE 4 MG/ML IJ SOLN: 8.0000 mg | Freq: Once | INTRAMUSCULAR | Status: DC | PRN

## 2015-02-16 MED ORDER — BSS IO SOLN
INTRAOCULAR | Status: DC | PRN
  Administered 2015-02-16: 72 mL via OPHTHALMIC

## 2015-02-16 MED ORDER — LACTATED RINGERS IV SOLN: 500.0000 mL | INTRAVENOUS | Status: DC

## 2015-02-16 MED ORDER — LIDOCAINE HCL (PF) 4 % IJ SOLN
INTRAOCULAR | Status: DC | PRN
  Administered 2015-02-16: 1 mL via OPHTHALMIC

## 2015-02-16 MED ORDER — FENTANYL CITRATE (PF) 100 MCG/2ML IJ SOLN
INTRAMUSCULAR | Status: DC | PRN
  Administered 2015-02-16 (×2): 50 ug via INTRAVENOUS

## 2015-02-16 MED ORDER — FENTANYL CITRATE (PF) 100 MCG/2ML IJ SOLN: 25.0000 ug | INTRAMUSCULAR | Status: DC | PRN

## 2015-02-16 MED ORDER — BRIMONIDINE TARTRATE 0.2 % OP SOLN
OPHTHALMIC | Status: DC | PRN
  Administered 2015-02-16: 1 [drp] via OPHTHALMIC

## 2015-02-16 MED ORDER — OXYCODONE HCL 5 MG PO TABS
5.0000 mg | ORAL_TABLET | Freq: Once | ORAL | Status: DC | PRN
Start: 2015-02-16 — End: 2015-02-16

## 2015-02-16 SURGICAL SUPPLY — 28 items
CANNULA ANT/CHMB 27GA (MISCELLANEOUS) ×3 IMPLANT
CARTRIDGE ABBOTT (MISCELLANEOUS) ×3 IMPLANT
GLOVE SURG LX 7.5 STRW (GLOVE) ×2
GLOVE SURG LX STRL 7.5 STRW (GLOVE) ×1 IMPLANT
GLOVE SURG TRIUMPH 8.0 PF LTX (GLOVE) ×3 IMPLANT
GOWN STRL REUS W/ TWL LRG LVL3 (GOWN DISPOSABLE) ×2 IMPLANT
GOWN STRL REUS W/TWL LRG LVL3 (GOWN DISPOSABLE) ×4
LENS IOL TECNIS 21 (Intraocular Lens) ×1 IMPLANT
LENS IOL TECNIS 21.0 (Intraocular Lens) ×2 IMPLANT
LENS IOL TECNIS MONO 1P 21.0 (Intraocular Lens) ×1 IMPLANT
MARKER SKIN SURG W/RULER VIO (MISCELLANEOUS) ×3 IMPLANT
NDL RETROBULBAR .5 NSTRL (NEEDLE) IMPLANT
NEEDLE FILTER BLUNT 18X 1/2SAF (NEEDLE) ×2
NEEDLE FILTER BLUNT 18X1 1/2 (NEEDLE) ×1 IMPLANT
PACK CATARACT BRASINGTON (MISCELLANEOUS) ×3 IMPLANT
PACK EYE AFTER SURG (MISCELLANEOUS) ×3 IMPLANT
PACK OPTHALMIC (MISCELLANEOUS) ×3 IMPLANT
RING MALYGIN 7.0 (MISCELLANEOUS) IMPLANT
SUT ETHILON 10-0 CS-B-6CS-B-6 (SUTURE)
SUT VICRYL  9 0 (SUTURE)
SUT VICRYL 9 0 (SUTURE) IMPLANT
SUTURE EHLN 10-0 CS-B-6CS-B-6 (SUTURE) IMPLANT
SYR 3ML LL SCALE MARK (SYRINGE) ×3 IMPLANT
SYR 5ML LL (SYRINGE) IMPLANT
SYR TB 1ML LUER SLIP (SYRINGE) ×3 IMPLANT
WATER STERILE IRR 250ML POUR (IV SOLUTION) ×3 IMPLANT
WATER STERILE IRR 500ML POUR (IV SOLUTION) IMPLANT
WIPE NON LINTING 3.25X3.25 (MISCELLANEOUS) ×3 IMPLANT

## 2015-02-16 NOTE — Anesthesia Preprocedure Evaluation (Signed)
Anesthesia Evaluation  Patient identified by MRN, date of birth, ID band Patient awake    Reviewed: Allergy & Precautions, H&P , Patient's Chart, lab work & pertinent test results  History of Anesthesia Complications Negative for: history of anesthetic complications  Airway Mallampati: II  TM Distance: >3 FB Neck ROM: full    Dental no notable dental hx.    Pulmonary COPD,  COPD inhaler, former smoker,    Pulmonary exam normal        Cardiovascular hypertension, Normal cardiovascular exam     Neuro/Psych    GI/Hepatic Neg liver ROS, GERD  Medicated,  Endo/Other  negative endocrine ROS  Renal/GU Renal disease     Musculoskeletal   Abdominal   Peds  Hematology negative hematology ROS (+)   Anesthesia Other Findings   Reproductive/Obstetrics                             Anesthesia Physical  Anesthesia Plan  ASA: II  Anesthesia Plan: MAC   Post-op Pain Management:    Induction:   Airway Management Planned:   Additional Equipment:   Intra-op Plan:   Post-operative Plan:   Informed Consent: I have reviewed the patients History and Physical, chart, labs and discussed the procedure including the risks, benefits and alternatives for the proposed anesthesia with the patient or authorized representative who has indicated his/her understanding and acceptance.     Plan Discussed with: CRNA  Anesthesia Plan Comments:         Anesthesia Quick Evaluation

## 2015-02-16 NOTE — Transfer of Care (Signed)
Immediate Anesthesia Transfer of Care Note  Patient: Katie Conway  Procedure(s) Performed: Procedure(s): CATARACT EXTRACTION PHACO AND INTRAOCULAR LENS PLACEMENT (IOC) (Right)  Patient Location: PACU  Anesthesia Type: MAC  Level of Consciousness: awake, alert  and patient cooperative  Airway and Oxygen Therapy: Patient Spontanous Breathing and Patient connected to supplemental oxygen  Post-op Assessment: Post-op Vital signs reviewed, Patient's Cardiovascular Status Stable, Respiratory Function Stable, Patent Airway and No signs of Nausea or vomiting  Post-op Vital Signs: Reviewed and stable  Complications: No apparent anesthesia complications

## 2015-02-16 NOTE — Op Note (Signed)
LOCATION:  [facility]   PREOPERATIVE DIAGNOSIS:    Nuclear sclerotic cataract right eye. H25.11   POSTOPERATIVE DIAGNOSIS:  Nuclear sclerotic cataract right eye.     PROCEDURE:  Phacoemusification with posterior chamber intraocular lens placement of the right eye   LENS:   Implant Name Type Inv. Item Serial No. Manufacturer Lot No. LRB No. Used  LENS IMPL INTRAOC [id number] 21.0 - [id number] Intraocular Lens LENS IMPL INTRAOC ZCB00 21.0 [id number]   Right 1        ULTRASOUND TIME: 9.5 % of 0 minutes, 55 seconds.  CDE 5.3   SURGEON:  Wyonia Hough, MD   ANESTHESIA: Topical with tetracaine drops and 2% Xylocaine jelly, augmented with 1% preservative-free intracameral lidocaine.    COMPLICATIONS:  None.   DESCRIPTION OF PROCEDURE:  The patient was identified in the holding room and transported to the operating room and placed in the supine position under the operating microscope.  The right eye was identified as the operative eye and it was prepped and draped in the usual sterile ophthalmic fashion.   A 1 millimeter clear-corneal paracentesis was made at the 12:00 position. 0.5 ml of preservative-free 1% lidocaine was injected into the anterior chamber.  The anterior chamber was filled with Viscoat viscoelastic.  A 2.4 millimeter keratome was used to make a near-clear corneal incision at the 9:00 position.  A curvilinear capsulorrhexis was made with a cystotome and capsulorrhexis forceps.  Balanced salt solution was used to hydrodissect and hydrodelineate the nucleus.   Phacoemulsification was then used in stop and chop fashion to remove the lens nucleus and epinucleus.  The remaining cortex was then removed using the irrigation and aspiration handpiece. Provisc was then placed into the capsular bag to distend it for lens placement.  A lens was then injected into the capsular bag.  The remaining viscoelastic was aspirated.   Wounds were hydrated with balanced salt  solution.  The anterior chamber was inflated to a physiologic pressure with balanced salt solution.  No wound leaks were noted. Cefuroxime 0.1 ml of a 10mg /ml solution was injected into the anterior chamber for a dose of 1 mg of intracameral antibiotic at the completion of the case.   Timolol and Brimonidine drops were applied to the eye.  The patient was taken to the recovery room in stable condition without complications of anesthesia or surgery.   Jayvon Mounger 02/16/2015, 9:19 AM

## 2015-02-16 NOTE — H&P (Signed)
  The History and Physical notes are on paper, have been signed, and are to be scanned. The patient remains stable and unchanged from the H&P.   Previous H&P reviewed, patient examined, and there are no changes.  Lianne Carreto 02/16/2015 8:12 AM

## 2015-02-16 NOTE — Anesthesia Postprocedure Evaluation (Signed)
Anesthesia Post Note  Patient: Katie Conway  Procedure(s) Performed: Procedure(s) (LRB): CATARACT EXTRACTION PHACO AND INTRAOCULAR LENS PLACEMENT (IOC) (Right)  Patient location during evaluation: PACU Anesthesia Type: MAC Level of consciousness: awake and alert Pain management: pain level controlled Vital Signs Assessment: post-procedure vital signs reviewed and stable Respiratory status: spontaneous breathing, nonlabored ventilation and respiratory function stable Cardiovascular status: blood pressure returned to baseline and stable Postop Assessment: no signs of nausea or vomiting Anesthetic complications: no    Katie Conway D Tierria Boultinghouse

## 2015-02-16 NOTE — Anesthesia Procedure Notes (Signed)
Procedure Name: MAC Performed by: Michaelangelo Mittelman Pre-anesthesia Checklist: Patient identified, Emergency Drugs available, Suction available, Timeout performed and Patient being monitored Patient Re-evaluated:Patient Re-evaluated prior to inductionOxygen Delivery Method: Nasal cannula Placement Confirmation: positive ETCO2       

## 2015-02-17 ENCOUNTER — Encounter: Payer: Self-pay | Admitting: Ophthalmology

## 2015-04-24 ENCOUNTER — Emergency Department: Payer: Medicare HMO

## 2015-04-24 ENCOUNTER — Encounter: Payer: Self-pay | Admitting: Emergency Medicine

## 2015-04-24 ENCOUNTER — Observation Stay
Admission: EM | Admit: 2015-04-24 | Discharge: 2015-04-25 | Disposition: A | Discharge status: Home / Self Care | Payer: Medicare HMO | Attending: Internal Medicine | Admitting: Internal Medicine

## 2015-04-24 DIAGNOSIS — I2510 Atherosclerotic heart disease of native coronary artery without angina pectoris: Secondary | ICD-10-CM | POA: Insufficient documentation

## 2015-04-24 DIAGNOSIS — R0602 Shortness of breath: Secondary | ICD-10-CM | POA: Insufficient documentation

## 2015-04-24 DIAGNOSIS — J449 Chronic obstructive pulmonary disease, unspecified: Secondary | ICD-10-CM | POA: Insufficient documentation

## 2015-04-24 DIAGNOSIS — I4892 Unspecified atrial flutter: Secondary | ICD-10-CM | POA: Insufficient documentation

## 2015-04-24 DIAGNOSIS — Z7951 Long term (current) use of inhaled steroids: Secondary | ICD-10-CM | POA: Insufficient documentation

## 2015-04-24 DIAGNOSIS — Z886 Allergy status to analgesic agent status: Secondary | ICD-10-CM | POA: Insufficient documentation

## 2015-04-24 DIAGNOSIS — R079 Chest pain, unspecified: Principal | ICD-10-CM

## 2015-04-24 DIAGNOSIS — M1990 Unspecified osteoarthritis, unspecified site: Secondary | ICD-10-CM | POA: Insufficient documentation

## 2015-04-24 DIAGNOSIS — E7800 Pure hypercholesterolemia, unspecified: Secondary | ICD-10-CM | POA: Insufficient documentation

## 2015-04-24 DIAGNOSIS — M810 Age-related osteoporosis without current pathological fracture: Secondary | ICD-10-CM | POA: Insufficient documentation

## 2015-04-24 DIAGNOSIS — R918 Other nonspecific abnormal finding of lung field: Secondary | ICD-10-CM | POA: Diagnosis not present

## 2015-04-24 DIAGNOSIS — I471 Supraventricular tachycardia, unspecified: Secondary | ICD-10-CM

## 2015-04-24 DIAGNOSIS — I129 Hypertensive chronic kidney disease with stage 1 through stage 4 chronic kidney disease, or unspecified chronic kidney disease: Secondary | ICD-10-CM | POA: Insufficient documentation

## 2015-04-24 DIAGNOSIS — Z8249 Family history of ischemic heart disease and other diseases of the circulatory system: Secondary | ICD-10-CM | POA: Insufficient documentation

## 2015-04-24 DIAGNOSIS — Z825 Family history of asthma and other chronic lower respiratory diseases: Secondary | ICD-10-CM | POA: Insufficient documentation

## 2015-04-24 DIAGNOSIS — N183 Chronic kidney disease, stage 3 unspecified: Secondary | ICD-10-CM | POA: Diagnosis present

## 2015-04-24 DIAGNOSIS — Z9842 Cataract extraction status, left eye: Secondary | ICD-10-CM | POA: Insufficient documentation

## 2015-04-24 DIAGNOSIS — I071 Rheumatic tricuspid insufficiency: Secondary | ICD-10-CM | POA: Insufficient documentation

## 2015-04-24 DIAGNOSIS — I214 Non-ST elevation (NSTEMI) myocardial infarction: Secondary | ICD-10-CM

## 2015-04-24 DIAGNOSIS — K219 Gastro-esophageal reflux disease without esophagitis: Secondary | ICD-10-CM | POA: Insufficient documentation

## 2015-04-24 DIAGNOSIS — D72829 Elevated white blood cell count, unspecified: Secondary | ICD-10-CM | POA: Insufficient documentation

## 2015-04-24 DIAGNOSIS — Z888 Allergy status to other drugs, medicaments and biological substances status: Secondary | ICD-10-CM | POA: Insufficient documentation

## 2015-04-24 DIAGNOSIS — R7989 Other specified abnormal findings of blood chemistry: Secondary | ICD-10-CM

## 2015-04-24 DIAGNOSIS — Z9841 Cataract extraction status, right eye: Secondary | ICD-10-CM | POA: Diagnosis not present

## 2015-04-24 DIAGNOSIS — Z87891 Personal history of nicotine dependence: Secondary | ICD-10-CM | POA: Diagnosis not present

## 2015-04-24 DIAGNOSIS — Z79899 Other long term (current) drug therapy: Secondary | ICD-10-CM | POA: Diagnosis not present

## 2015-04-24 DIAGNOSIS — E559 Vitamin D deficiency, unspecified: Secondary | ICD-10-CM | POA: Diagnosis not present

## 2015-04-24 DIAGNOSIS — R778 Other specified abnormalities of plasma proteins: Secondary | ICD-10-CM

## 2015-04-24 LAB — COMPREHENSIVE METABOLIC PANEL
ALBUMIN: 3.5 g/dL (ref 3.5–5.0)
ALK PHOS: 56 U/L (ref 38–126)
ALT: 27 U/L (ref 14–54)
AST: 28 U/L (ref 15–41)
Anion gap: 6 (ref 5–15)
BILIRUBIN TOTAL: 0.8 mg/dL (ref 0.3–1.2)
BUN: 29 mg/dL — ABNORMAL HIGH (ref 6–20)
CALCIUM: 9.1 mg/dL (ref 8.9–10.3)
CO2: 28 mmol/L (ref 22–32)
Chloride: 103 mmol/L (ref 101–111)
Creatinine, Ser: 2.17 mg/dL — ABNORMAL HIGH (ref 0.44–1.00)
GFR calc Af Amer: 25 mL/min — ABNORMAL LOW (ref >60)
GFR calc non Af Amer: 21 mL/min — ABNORMAL LOW (ref >60)
GLUCOSE: 142 mg/dL — AB (ref 65–99)
Potassium: 3.9 mmol/L (ref 3.5–5.1)
SODIUM: 137 mmol/L (ref 135–145)
TOTAL PROTEIN: 7.5 g/dL (ref 6.5–8.1)

## 2015-04-24 LAB — TROPONIN I
TROPONIN I: 0.5 ng/mL — AB (ref <0.031)
Troponin I: 0.43 ng/mL — ABNORMAL HIGH (ref <0.031)
Troponin I: 0.58 ng/mL — ABNORMAL HIGH (ref <0.031)

## 2015-04-24 LAB — CBC WITH DIFFERENTIAL/PLATELET
BASOS ABS: 0.1 10*3/uL (ref 0–0.1)
BASOS PCT: 1 %
Eosinophils Absolute: 0.2 10*3/uL (ref 0–0.7)
Eosinophils Relative: 1 %
HEMATOCRIT: 36 % (ref 35.0–47.0)
HEMOGLOBIN: 12 g/dL (ref 12.0–16.0)
Lymphocytes Relative: 13 %
Lymphs Abs: 2.1 10*3/uL (ref 1.0–3.6)
MCH: 26.5 pg (ref 26.0–34.0)
MCHC: 33.3 g/dL (ref 32.0–36.0)
MCV: 79.5 fL — ABNORMAL LOW (ref 80.0–100.0)
MONOS PCT: 5 %
Monocytes Absolute: 0.8 10*3/uL (ref 0.2–0.9)
NEUTROS ABS: 12.9 10*3/uL — AB (ref 1.4–6.5)
NEUTROS PCT: 80 %
Platelets: 283 10*3/uL (ref 150–440)
RBC: 4.53 MIL/uL (ref 3.80–5.20)
RDW: 14.3 % (ref 11.5–14.5)
WBC: 16.2 10*3/uL — AB (ref 3.6–11.0)

## 2015-04-24 LAB — MAGNESIUM: Magnesium: 1.6 mg/dL — ABNORMAL LOW (ref 1.7–2.4)

## 2015-04-24 LAB — TSH: TSH: 1.618 u[IU]/mL (ref 0.350–4.500)

## 2015-04-24 MED ORDER — POTASSIUM CHLORIDE IN NACL 20-0.9 MEQ/L-% IV SOLN
INTRAVENOUS | Status: DC
Start: 2015-04-24 — End: 2015-04-25
  Administered 2015-04-24 – 2015-04-25 (×2): via INTRAVENOUS
  Filled 2015-04-24 (×3): qty 1000

## 2015-04-24 MED ORDER — PANTOPRAZOLE SODIUM 40 MG PO TBEC
40.0000 mg | DELAYED_RELEASE_TABLET | Freq: Every day | ORAL | Status: DC
  Administered 2015-04-25: 40 mg via ORAL
  Filled 2015-04-24 (×2): qty 1

## 2015-04-24 MED ORDER — NITROGLYCERIN 0.4 MG SL SUBL: 0.4000 mg | SUBLINGUAL_TABLET | SUBLINGUAL | Status: DC | PRN

## 2015-04-24 MED ORDER — SODIUM CHLORIDE 0.9% FLUSH
3.0000 mL | Freq: Two times a day (BID) | INTRAVENOUS | Status: DC
  Administered 2015-04-25: 3 mL via INTRAVENOUS

## 2015-04-24 MED ORDER — HEPARIN SODIUM (PORCINE) 5000 UNIT/ML IJ SOLN
5000.0000 [IU] | Freq: Three times a day (TID) | INTRAMUSCULAR | Status: DC
  Filled 2015-04-24: qty 1

## 2015-04-24 MED ORDER — ADENOSINE 12 MG/4ML IV SOLN
INTRAVENOUS | Status: AC
  Filled 2015-04-24: qty 4

## 2015-04-24 MED ORDER — ASPIRIN 81 MG PO CHEW
324.0000 mg | CHEWABLE_TABLET | Freq: Once | ORAL | Status: AC
  Administered 2015-04-24: 324 mg via ORAL
  Filled 2015-04-24: qty 4

## 2015-04-24 MED ORDER — DOCUSATE SODIUM 100 MG PO CAPS
100.0000 mg | ORAL_CAPSULE | Freq: Two times a day (BID) | ORAL | Status: DC
  Administered 2015-04-24: 100 mg via ORAL
  Filled 2015-04-24 (×2): qty 1

## 2015-04-24 MED ORDER — ACETAMINOPHEN 325 MG PO TABS: 650.0000 mg | ORAL_TABLET | Freq: Four times a day (QID) | ORAL | Status: DC | PRN

## 2015-04-24 MED ORDER — METOPROLOL TARTRATE 25 MG PO TABS
25.0000 mg | ORAL_TABLET | Freq: Two times a day (BID) | ORAL | Status: DC
  Administered 2015-04-25: 25 mg via ORAL
  Filled 2015-04-24 (×2): qty 1

## 2015-04-24 MED ORDER — MORPHINE SULFATE (PF) 4 MG/ML IV SOLN
4.0000 mg | Freq: Once | INTRAVENOUS | Status: AC
  Administered 2015-04-24: 4 mg via INTRAVENOUS
  Filled 2015-04-24: qty 1

## 2015-04-24 MED ORDER — MORPHINE SULFATE (PF) 2 MG/ML IV SOLN
2.0000 mg | INTRAVENOUS | Status: DC | PRN
Start: 2015-04-24 — End: 2015-04-25

## 2015-04-24 MED ORDER — ADENOSINE 6 MG/2ML IV SOLN
6.0000 mg | Freq: Once | INTRAVENOUS | Status: AC
  Administered 2015-04-24: 6 mg via INTRAVENOUS

## 2015-04-24 MED ORDER — ONDANSETRON HCL 4 MG/2ML IJ SOLN: 4.0000 mg | Freq: Four times a day (QID) | INTRAMUSCULAR | Status: DC | PRN

## 2015-04-24 MED ORDER — ADENOSINE 6 MG/2ML IV SOLN
INTRAVENOUS | Status: AC
  Filled 2015-04-24: qty 2

## 2015-04-24 MED ORDER — IPRATROPIUM-ALBUTEROL 0.5-2.5 (3) MG/3ML IN SOLN
3.0000 mL | Freq: Four times a day (QID) | RESPIRATORY_TRACT | Status: DC
  Administered 2015-04-24 – 2015-04-25 (×3): 3 mL via RESPIRATORY_TRACT
  Filled 2015-04-24 (×4): qty 3

## 2015-04-24 MED ORDER — ONDANSETRON HCL 4 MG/2ML IJ SOLN
4.0000 mg | Freq: Once | INTRAMUSCULAR | Status: AC
  Administered 2015-04-24: 4 mg via INTRAVENOUS
  Filled 2015-04-24: qty 2

## 2015-04-24 MED ORDER — PREDNISONE 10 MG PO TABS
5.0000 mg | ORAL_TABLET | Freq: Every day | ORAL | Status: DC
  Administered 2015-04-25: 5 mg via ORAL
  Filled 2015-04-24: qty 1

## 2015-04-24 MED ORDER — PNEUMOCOCCAL VAC POLYVALENT 25 MCG/0.5ML IJ INJ
0.5000 mL | INJECTION | INTRAMUSCULAR | Status: DC
  Filled 2015-04-24: qty 0.5

## 2015-04-24 MED ORDER — ALBUTEROL SULFATE (2.5 MG/3ML) 0.083% IN NEBU
3.0000 mL | INHALATION_SOLUTION | RESPIRATORY_TRACT | Status: DC | PRN
  Administered 2015-04-25 (×3): 3 mL via RESPIRATORY_TRACT
  Filled 2015-04-24 (×3): qty 3

## 2015-04-24 MED ORDER — ONDANSETRON HCL 4 MG PO TABS: 4.0000 mg | ORAL_TABLET | Freq: Four times a day (QID) | ORAL | Status: DC | PRN

## 2015-04-24 MED ORDER — MOMETASONE FURO-FORMOTEROL FUM 200-5 MCG/ACT IN AERO
2.0000 | INHALATION_SPRAY | Freq: Two times a day (BID) | RESPIRATORY_TRACT | Status: DC
  Administered 2015-04-24 – 2015-04-25 (×2): 2 via RESPIRATORY_TRACT
  Filled 2015-04-24: qty 8.8

## 2015-04-24 MED ORDER — NITROGLYCERIN 2 % TD OINT
0.5000 [in_us] | TOPICAL_OINTMENT | Freq: Four times a day (QID) | TRANSDERMAL | Status: DC
  Administered 2015-04-24 – 2015-04-25 (×4): 0.5 [in_us] via TOPICAL
  Filled 2015-04-24 (×4): qty 1

## 2015-04-24 MED ORDER — BISACODYL 10 MG RE SUPP: 10.0000 mg | Freq: Every day | RECTAL | Status: DC | PRN

## 2015-04-24 MED ORDER — ACETAMINOPHEN 650 MG RE SUPP: 650.0000 mg | Freq: Four times a day (QID) | RECTAL | Status: DC | PRN

## 2015-04-24 NOTE — H&P (Signed)
History and Physical    Katie Conway [id number] DOB: April 02, 1940 DOA: 04/24/2015  Referring physician: Dr. Edd Fabian PCP: Sherrin Daisy, MD  Specialists: Dr. Raul Del  Chief Complaint: chest pain at rest  HPI: Katie Conway is a 75 y.o. female has a past medical history significant for COPD, HTN, and CKD with no hx of heart issues who awoke early this AM with CP described as tightness with mild SOB. Pain was midsternal with no radiation graded 8/10. Sx's lasted 2-3 hours and resolved spontaneously. Presents to ER now with recurrent episodes of chest tightness, still non-radiating, with mild SOB. Sx's not as severe(6/10). Currently pain-free. In ER, she was noted to be in SVT which resolved with Adenosine. Troponin mildly elevated. She is now admitted.  Review of Systems: The patient denies anorexia, fever, weight loss,, vision loss, decreased hearing, hoarseness,  syncope, dyspnea on exertion, peripheral edema, balance deficits, hemoptysis, abdominal pain, melena, hematochezia, severe indigestion/heartburn, hematuria, incontinence, genital sores, muscle weakness, suspicious skin lesions, transient blindness, difficulty walking, depression, unusual weight change, abnormal bleeding, enlarged lymph nodes, angioedema, and breast masses.   Past Medical History  Diagnosis Date  . COPD (chronic obstructive pulmonary disease) ([place])   . Renal insufficiency   . Arthritis     Knees  . Wears dentures     Full - upper.  Partial - Lower  . GERD (gastroesophageal reflux disease)   . Diverticulosis   . Shortness of breath dyspnea     with exertion  . Osteoporosis   . Hypertension   . Chronic bronchitis ([place])   . Wears dentures full upper and partial lower   Past Surgical History  Procedure Laterality Date  . Cesarean section    . Cataract extraction w/phaco Left 06/02/2014    Procedure: CATARACT EXTRACTION PHACO AND INTRAOCULAR LENS PLACEMENT (IOC);  Surgeon: Leandrew Koyanagi, MD;   Location: [facility];  Service: Ophthalmology;  Laterality: Left;  . Cataract extraction w/phaco Right 02/16/2015    Procedure: CATARACT EXTRACTION PHACO AND INTRAOCULAR LENS PLACEMENT (IOC);  Surgeon: Leandrew Koyanagi, MD;  Location: [facility];  Service: Ophthalmology;  Laterality: Right;   Social History:  reports that she quit smoking about 4 years ago. Her smoking use included Cigarettes. She does not have any smokeless tobacco history on file. She reports that she does not drink alcohol. Her drug history is not on file.  Allergies  Allergen Reactions  . Aspirin Other (See Comments)    GI Upset  . Amlodipine Rash and Other (See Comments)    Fatigue Fatigue    Family History  Problem Relation Age of Onset  . Heart failure Mother   . Heart attack Father   . COPD Brother   . Kidney failure Brother     Prior to Admission medications   Medication Sig Start Date End Date Taking? Authorizing Provider  albuterol (PROVENTIL HFA;VENTOLIN HFA) 108 (90 BASE) MCG/ACT inhaler Inhale 2 puffs into the lungs 4 (four) times daily.     Historical Provider, MD  Calcium 75 MG TABS Take 800 mg by mouth every morning.    Historical Provider, MD  Calcium Carbonate (CALTRATE 600 PO) Take by mouth. Reported on 02/10/2015    Historical Provider, MD  Calcium-Vitamin D (CALTRATE 600 PLUS-VIT D PO) Take by mouth daily. Reported on 02/10/2015    Historical Provider, MD  hydrochlorothiazide (HYDRODIURIL) 25 MG tablet Take 25 mg by mouth daily. AM    Historical Provider, MD  ipratropium (ATROVENT HFA) 17 MCG/ACT  inhaler Inhale 2 puffs into the lungs 4 (four) times daily. Reported on 02/10/2015    Historical Provider, MD  ipratropium-albuterol (DUONEB) 0.5-2.5 (3) MG/3ML SOLN Take 3 mLs by nebulization 4 (four) times daily. Uses 4-6 times per day    Historical Provider, MD  metoprolol succinate (TOPROL-XL) 25 MG 24 hr tablet Take 25 mg by mouth daily. Reported on 02/16/2015    Historical Provider,  MD  Multiple Vitamins-Minerals (HAIR/SKIN/NAILS PO) Take by mouth daily. AM    Historical Provider, MD  nicotine polacrilex (COMMIT) 4 MG lozenge Take 4 mg by mouth as needed for smoking cessation. Reported on 02/10/2015    Historical Provider, MD  Omega-3 Fatty Acids (OMEGA-3 FISH OIL) 300 MG CAPS Take 1 capsule by mouth daily.    Historical Provider, MD  predniSONE (DELTASONE) 5 MG tablet Take 5 mg by mouth daily with breakfast.     Historical Provider, MD  ranitidine (ZANTAC) 150 MG tablet Take 150 mg by mouth once. AM    Historical Provider, MD  vitamin B-12 (CYANOCOBALAMIN) 500 MCG tablet Take 1,000 mcg by mouth daily. Reported on 02/16/2015    Historical Provider, MD   Physical Exam: Filed Vitals:   04/24/15 1100 04/24/15 1130 04/24/15 1200 04/24/15 1230  BP: 119/76 125/69 127/70 131/80  Pulse: 90 84 79 80  Temp:      TempSrc:      Resp: 29 17 16 26   SpO2: 99% 100% 97% 98%     General:  No apparent distress, WDWN, [place]/AT  Eyes: PERRL, EOMI, no scleral icterus, conjunctiva clear  ENT: moist oropharynx without exudate or lesions, dentition good, TM's benign  Neck: supple, no lymphadenopathy. No bruits or thyromegaly  Cardiovascular: regular rate without MRG; 2+ peripheral pulses, no JVD, no peripheral edema  Respiratory: CTA biL, good air movement without wheezing, rhonchi or crackled, respiratory effort normal  Abdomen: soft, non tender to palpation, positive bowel sounds, no guarding, no rebound, no organomegaly  Skin: no rashes or lesions  Musculoskeletal: normal bulk and tone, no joint swelling  Psychiatric: normal mood and affect, A&OX3  Neurologic: CN 2-12 grossly intact, Motor strength 5/5 in all 4 groups with symmetric DTR's and normal sensory exam  Labs on Admission:  Basic Metabolic Panel:  Recent Labs Lab 04/24/15 1051  NA 137  K 3.9  CL 103  CO2 28  GLUCOSE 142*  BUN 29*  CREATININE 2.17*  CALCIUM 9.1   Liver Function Tests:  Recent Labs Lab  04/24/15 1051  AST 28  ALT 27  ALKPHOS 56  BILITOT 0.8  PROT 7.5  ALBUMIN 3.5   No results for input(s): LIPASE, AMYLASE in the last 168 hours. No results for input(s): AMMONIA in the last 168 hours. CBC:  Recent Labs Lab 04/24/15 1051  WBC 16.2*  NEUTROABS 12.9*  HGB 12.0  HCT 36.0  MCV 79.5*  PLT 283   Cardiac Enzymes:  Recent Labs Lab 04/24/15 1051  TROPONINI 0.43*    BNP (last 3 results) No results for input(s): BNP in the last 8760 hours.  ProBNP (last 3 results) No results for input(s): PROBNP in the last 8760 hours.  CBG: No results for input(s): GLUCAP in the last 168 hours.  Radiological Exams on Admission: Dg Chest Portable 1 View  04/24/2015  CLINICAL DATA:  Intermittent chest tightness since last night, known COPD, hypertension, former smoker EXAM: PORTABLE CHEST 1 VIEW COMPARISON:  Portable exam 1107 hours compared to 03/23/2009 FINDINGS: Normal heart size, mediastinal contours, and pulmonary  vascularity. Lungs hyperinflated but clear. No pleural effusion or pneumothorax. Bones unremarkable. IMPRESSION: Hyperinflated lungs without acute infiltrate. Electronically Signed   By: Lavonia Dana M.D.   On: 04/24/2015 11:25    EKG: Independently reviewed.  Assessment/Plan Principal Problem:   Chest pain at rest Active Problems:   Chronic kidney disease (CKD), stage III (moderate)   SVT (supraventricular tachycardia) (HCC)   Elevated troponin   Will observe on telemetry with topical NTP and prn SL NTG. Follow enzymes and order echo. Consult Cardiology. Check TSH and magnesium. Increase metoprolol. Repeat labs in AM.  Diet: heart healthy Fluids: NS with K+@75  DVT Prophylaxis: SQ Heparin  Code Status: FULL  Family Communication: yes  Disposition Plan: home  Time spent: 45 min

## 2015-04-24 NOTE — ED Provider Notes (Signed)
[facility] Emergency Department Provider Note  ____________________________________________  Time seen: Approximately 10:33 AM  I have reviewed the triage vital signs and the nursing notes.   HISTORY  Chief Complaint Chest Pain    HPI Katie Conway is a 75 y.o. female with history of COPD, chronic kidney disease, hypertension who presents for evaluation of chest tightness which began last night associated with shortness of breath, gradual onset, constant since onset, currently moderate, worse with exertion. No cough, sneezing, vomiting, diarrhea, fevers or chills.   Past Medical History  Diagnosis Date  . COPD (chronic obstructive pulmonary disease) ([place])   . Renal insufficiency   . Arthritis     Knees  . Wears dentures     Full - upper.  Partial - Lower  . GERD (gastroesophageal reflux disease)   . Diverticulosis   . Shortness of breath dyspnea     with exertion  . Osteoporosis   . Hypertension   . Chronic bronchitis ([place])   . Wears dentures full upper and partial lower    Patient Active Problem List   Diagnosis Date Noted  . Acid reflux 09/22/2014  . BP (high blood pressure) 09/22/2014  . Leukocytosis 09/22/2014  . Cataract 06/08/2014  . CAFL (chronic airflow limitation) ([place]) 03/22/2014  . Hypercholesterolemia without hypertriglyceridemia 11/27/2013  . Avitaminosis D 08/31/2013  . Chronic kidney disease (CKD), stage III (moderate) 05/16/2011  . Essential (primary) hypertension 05/16/2011    Past Surgical History  Procedure Laterality Date  . Cesarean section    . Cataract extraction w/phaco Left 06/02/2014    Procedure: CATARACT EXTRACTION PHACO AND INTRAOCULAR LENS PLACEMENT (IOC);  Surgeon: Leandrew Koyanagi, MD;  Location: [place];  Service: Ophthalmology;  Laterality: Left;  . Cataract extraction w/phaco Right 02/16/2015    Procedure: CATARACT EXTRACTION PHACO AND INTRAOCULAR LENS PLACEMENT (IOC);  Surgeon:  Leandrew Koyanagi, MD;  Location: [place];  Service: Ophthalmology;  Laterality: Right;    Current Outpatient Rx  Name  Route  Sig  Dispense  Refill  . albuterol (PROVENTIL HFA;VENTOLIN HFA) 108 (90 BASE) MCG/ACT inhaler   Inhalation   Inhale 2 puffs into the lungs 4 (four) times daily.          . Calcium 75 MG TABS   Oral   Take 800 mg by mouth every morning.         . Calcium Carbonate (CALTRATE 600 PO)   Oral   Take by mouth. Reported on 02/10/2015         . Calcium-Vitamin D (CALTRATE 600 PLUS-VIT D PO)   Oral   Take by mouth daily. Reported on 02/10/2015         . hydrochlorothiazide (HYDRODIURIL) 25 MG tablet   Oral   Take 25 mg by mouth daily. AM         . ipratropium (ATROVENT HFA) 17 MCG/ACT inhaler   Inhalation   Inhale 2 puffs into the lungs 4 (four) times daily. Reported on 02/10/2015         . ipratropium-albuterol (DUONEB) 0.5-2.5 (3) MG/3ML SOLN   Nebulization   Take 3 mLs by nebulization 4 (four) times daily. Uses 4-6 times per day         . metoprolol succinate (TOPROL-XL) 25 MG 24 hr tablet   Oral   Take 25 mg by mouth. Reported on 02/16/2015         . metoprolol tartrate (LOPRESSOR) 25 MG tablet   Oral   Take  25 mg by mouth daily. AM         . Multiple Vitamins-Minerals (HAIR/SKIN/NAILS PO)   Oral   Take by mouth daily. AM         . nicotine polacrilex (COMMIT) 4 MG lozenge   Oral   Take 4 mg by mouth as needed for smoking cessation. Reported on 02/10/2015         . Omega-3 Fatty Acids (OMEGA-3 FISH OIL) 300 MG CAPS   Oral   Take 1 capsule by mouth daily.         . predniSONE (DELTASONE) 5 MG tablet   Oral   Take 5 mg by mouth daily with breakfast.          . ranitidine (ZANTAC) 150 MG tablet   Oral   Take 150 mg by mouth once. AM         . vitamin B-12 (CYANOCOBALAMIN) 500 MCG tablet   Oral   Take 1,000 mcg by mouth daily. Reported on 02/16/2015           Allergies Aspirin and  Amlodipine  Family History  Problem Relation Age of Onset  . Heart failure Mother   . Heart attack Father   . COPD Brother   . Kidney failure Brother     Social History Social History  Substance Use Topics  . Smoking status: Former Smoker    Types: Cigarettes    Quit date: 01/23/2011  . Smokeless tobacco: None     Comment: Previous smoker; 1 pack a day for 50 years...  . Alcohol Use: No     Comment: glass wine 2-3 times per year    Review of Systems Constitutional: No fever/chills Eyes: No visual changes. ENT: No sore throat. Cardiovascular: + chest pain. Respiratory: + shortness of breath. Gastrointestinal: No abdominal pain.  No nausea, no vomiting.  No diarrhea.  No constipation. Genitourinary: Negative for dysuria. Musculoskeletal: Negative for back pain. Skin: Negative for rash. Neurological: Negative for headaches, focal weakness or numbness.  10-point ROS otherwise negative.  ____________________________________________   PHYSICAL EXAM:  Filed Vitals:   04/24/15 1056 04/24/15 1100 04/24/15 1130 04/24/15 1200  BP: 128/77 119/76 125/69 127/70  Pulse: 90 90 84 79  Temp:      TempSrc:      Resp: 16 29 17 16   SpO2: 99% 99% 100% 97%      Constitutional: Alert and oriented. In mild respiratory distress with tachypnea. Eyes: Conjunctivae are normal. PERRL. EOMI. Head: Atraumatic. Nose: No congestion/rhinnorhea. Mouth/Throat: Mucous membranes are moist.  Oropharynx non-erythematous. Neck: No stridor. Supple without meningismus. Cardiovascular: tachycardic rate, regular rhythm. Grossly normal heart sounds.  Good peripheral circulation. Respiratory: Tachypnea with mildly increased work of breathing.  No retractions. Lungs CTAB. Gastrointestinal: Soft and nontender. No distention. No CVA tenderness. Genitourinary: Deferred Musculoskeletal: No lower extremity tenderness nor edema.  No joint effusions. Neurologic:  Normal speech and language. No gross focal  neurologic deficits are appreciated. No gait instability. Skin:  Skin is warm, dry and intact. No rash noted. Psychiatric: Mood and affect are normal. Speech and behavior are normal.  ____________________________________________   LABS (all labs ordered are listed, but only abnormal results are displayed)  Labs Reviewed  CBC WITH DIFFERENTIAL/PLATELET - Abnormal; Notable for the following:    WBC 16.2 (*)    MCV 79.5 (*)    Neutro Abs 12.9 (*)    All other components within normal limits  COMPREHENSIVE METABOLIC PANEL - Abnormal; Notable for the following:  Glucose, Bld 142 (*)    BUN 29 (*)    Creatinine, Ser 2.17 (*)    GFR calc non Af Amer 21 (*)    GFR calc Af Amer 25 (*)    All other components within normal limits  TROPONIN I - Abnormal; Notable for the following:    Troponin I 0.43 (*)    All other components within normal limits   ____________________________________________  EKG  ED ECG REPORT I, Joanne Gavel, the attending physician, personally viewed and interpreted this ECG.   Date: 04/24/2015  EKG Time: 10:31  Rate: 163  Rhythm: Supraventricular tachycardia.  Axis: normal  Intervals:none  ST&T Change: No acute ST elevation. Mild ST depression in lead 2, 3, aVF.  ED ECG REPORT I, Joanne Gavel, the attending physician, personally viewed and interpreted this ECG.   Date: 04/24/2015  EKG Time: 10:48  Rate: 95  Rhythm: normal EKG, normal sinus rhythm  Axis: normal  Intervals:none  ST&T Change: No acute ST elevation.  ____________________________________________  RADIOLOGY  CXR IMPRESSION: Hyperinflated lungs without acute infiltrate. ____________________________________________   PROCEDURES  Procedure(s) performed: Clinical/chemical cardioversion with 6 mg adenosine IV.  Critical Care performed: Yes, see critical care note(s). Total critical care time spent 35 minutes.  ____________________________________________   INITIAL  IMPRESSION / ASSESSMENT AND PLAN / ED COURSE  Pertinent labs & imaging results that were available during my care of the patient were reviewed by me and considered in my medical decision making (see chart for details).  Pamale Mumford is a 75 y.o. female with history of COPD, chronic kidney disease, hypertension who presents for evaluation of chest tightness which began last night associated with shortness of breath. Arrival to the emergency permit she appeared to be quite short of breath. Heart rate was in the 160s to 170s and EKG was concerning for supraventricular tachycardia which failed  vagal maneuvers. She received 6 mg of adenosine with conversion to normal sinus rhythm. She is maintaining adequate blood pressure. She says some mild chest tightness despite conversion, we'll give aspirin, obtain screening labs, chest x-ray, observed on cardiac monitor and treat her pain. Reassess for disposition.  ----------------------------------------- 12:25 PM on 04/24/2015 ----------------------------------------- Patient remains in sinus rhythm. She has complete resolution of her chest pain at this time. Labs Reviewed. Creatinine mildly elevated at 2.17 but the patient has a history of chronic kidney disease and her baseline is closer to 1.7. She received light IV fluids. CBC with leukocytosis, likely catecholamine/stress-induced.  troponin is elevated 0.43. Aspirin was given. Case discussed with the hospitals, Dr. Doy Hutching for admission at this time. ____________________________________________   FINAL CLINICAL IMPRESSION(S) / ED DIAGNOSES  Final diagnoses:  NSTEMI (non-ST elevated myocardial infarction) ([place])  SVT (supraventricular tachycardia) ([facility])      Joanne Gavel, MD 04/24/15 1227

## 2015-04-24 NOTE — ED Notes (Signed)
Patient presents to [facility] ed with CP last night with 'chest tightness' currently. +ShOB, Nausea. Denies Lightheadedness/Dizziness

## 2015-04-24 NOTE — ED Notes (Signed)
Pt alert and oriented. Respirations even and unlabored. Denies pain. Pt given meal.

## 2015-04-24 NOTE — Progress Notes (Signed)
Notified Dr Doy Hutching that second set of pt troponins back, and are trending up (0.43 - 0.58); Dr acknowledged, stated to please have unit clerk call previously ordered cardio consult to notify; unit clerk notified, and has called consult; no additional orders

## 2015-04-24 NOTE — ED Notes (Signed)
Troponin 0.43. MD notified.

## 2015-04-24 NOTE — ED Notes (Signed)
Pt c/o tightness that is intermittent in chest since last night.  Appears labored. Pt reports always has SHOB r/t COPD and does not feel more SHOB than normal

## 2015-04-25 ENCOUNTER — Observation Stay
Admit: 2015-04-25 | Discharge: 2015-04-25 | Disposition: A | Discharge status: Home / Self Care | Payer: Medicare HMO | Attending: Internal Medicine | Admitting: Internal Medicine

## 2015-04-25 LAB — CBC
HCT: 31.8 % — ABNORMAL LOW (ref 35.0–47.0)
Hemoglobin: 10.7 g/dL — ABNORMAL LOW (ref 12.0–16.0)
MCH: 26.8 pg (ref 26.0–34.0)
MCHC: 33.5 g/dL (ref 32.0–36.0)
MCV: 80 fL (ref 80.0–100.0)
PLATELETS: 230 10*3/uL (ref 150–440)
RBC: 3.98 MIL/uL (ref 3.80–5.20)
RDW: 14.3 % (ref 11.5–14.5)
WBC: 11.7 10*3/uL — ABNORMAL HIGH (ref 3.6–11.0)

## 2015-04-25 LAB — COMPREHENSIVE METABOLIC PANEL
ALBUMIN: 3 g/dL — AB (ref 3.5–5.0)
ALK PHOS: 55 U/L (ref 38–126)
ALT: 24 U/L (ref 14–54)
ANION GAP: 6 (ref 5–15)
AST: 25 U/L (ref 15–41)
BILIRUBIN TOTAL: 0.5 mg/dL (ref 0.3–1.2)
BUN: 31 mg/dL — AB (ref 6–20)
CALCIUM: 8.6 mg/dL — AB (ref 8.9–10.3)
CO2: 27 mmol/L (ref 22–32)
CREATININE: 1.99 mg/dL — AB (ref 0.44–1.00)
Chloride: 107 mmol/L (ref 101–111)
GFR calc Af Amer: 27 mL/min — ABNORMAL LOW (ref >60)
GFR calc non Af Amer: 24 mL/min — ABNORMAL LOW (ref >60)
GLUCOSE: 93 mg/dL (ref 65–99)
Potassium: 4.4 mmol/L (ref 3.5–5.1)
SODIUM: 140 mmol/L (ref 135–145)
TOTAL PROTEIN: 6.6 g/dL (ref 6.5–8.1)

## 2015-04-25 LAB — ECHOCARDIOGRAM COMPLETE
Height: 59 in
Weight: 2400 oz

## 2015-04-25 LAB — TROPONIN I: Troponin I: 0.36 ng/mL — ABNORMAL HIGH (ref <0.031)

## 2015-04-25 NOTE — Progress Notes (Signed)
Patient d/c'd home. Education provided, no questions at this time. Patient picked up by husband. Telemetry removed. Jeda Pardue R [place]  

## 2015-04-25 NOTE — Progress Notes (Signed)
Patient had episode of wheezing/SOB when arrived back on floor from echo. Breathing treatment administered, Dr. Manuella Ghazi notified. Improvement made, patient has not complaints at this time. Katie Conway

## 2015-04-25 NOTE — Discharge Instructions (Signed)

## 2015-04-25 NOTE — Consult Note (Signed)
[place]  CARDIOLOGY CONSULT NOTE  Patient ID: Katie Conway MRN: [id number] DOB/AGE: [birth year] 75 y.o.  Admit date: 04/24/2015 Referring Physician [facility] Primary Physician   Primary Cardiologist   Reason for Consultation chest pain.  HPI: Patient is a 75 year old female with history of COPD, chronic renal insufficiency grade 3-4 followed by [facility] nephrology, history of atherosclerotic heart disease who is admitted with complaints of chest pain. She also complained of shortness of breath. She awoke early morning of admission with complaints of chest pain and shortness of breath. It lasted several hours before resolving. She presented to the emergency room with intermittent episodes of chest tightness. She had SVT on presentation which resolved with adenosine. She had mild serum troponin elevation. Her serum creatinine was also somewhat elevated at 2.17. Subsequent creatinine is reduced to 1.99. Her baseline serum creatinine based on outpatient results is approximately 1.7-1.9. She has no further chest pain. EKG showed sinus rhythm with nonspecific ST-T wave changes. Chest x-ray showed hyperinflated lungs without acute infiltrate. She is feeling back to her baseline is anxious to be discharged.  Review of Systems  Constitutional: Negative.   HENT: Negative.   Eyes: Negative.   Respiratory: Positive for shortness of breath.   Cardiovascular: Positive for chest pain and palpitations.  Gastrointestinal: Negative.   Genitourinary: Negative.   Musculoskeletal: Negative.   Skin: Negative.   Neurological: Negative.   Endo/Heme/Allergies: Negative.   Psychiatric/Behavioral: Negative.     Past Medical History  Diagnosis Date  . COPD (chronic obstructive pulmonary disease) ([place])   . Renal insufficiency   . Arthritis     Knees  . Wears dentures     Full - upper.  Partial - Lower  . GERD (gastroesophageal reflux disease)   . Diverticulosis   .  Shortness of breath dyspnea     with exertion  . Osteoporosis   . Hypertension   . Chronic bronchitis ([place])   . Wears dentures full upper and partial lower    Family History  Problem Relation Age of Onset  . Heart failure Mother   . Heart attack Father   . COPD Brother   . Kidney failure Brother     Social History   Social History  . Marital Status: Married    Spouse Name: N/A  . Number of Children: N/A  . Years of Education: N/A   Occupational History  . Not on file.   Social History Main Topics  . Smoking status: Former Smoker    Types: Cigarettes    Quit date: 01/23/2011  . Smokeless tobacco: Not on file     Comment: Previous smoker; 1 pack a day for 50 years...  . Alcohol Use: No     Comment: glass wine 2-3 times per year  . Drug Use: Not on file  . Sexual Activity: Not on file   Other Topics Concern  . Not on file   Social History Narrative    Past Surgical History  Procedure Laterality Date  . Cesarean section    . Cataract extraction w/phaco Left 06/02/2014    Procedure: CATARACT EXTRACTION PHACO AND INTRAOCULAR LENS PLACEMENT (IOC);  Surgeon: Leandrew Koyanagi, MD;  Location: [place];  Service: Ophthalmology;  Laterality: Left;  . Cataract extraction w/phaco Right 02/16/2015    Procedure: CATARACT EXTRACTION PHACO AND INTRAOCULAR LENS PLACEMENT (IOC);  Surgeon: Leandrew Koyanagi, MD;  Location: [place];  Service: Ophthalmology;  Laterality: Right;  Prescriptions prior to admission  Medication Sig Dispense Refill Last Dose  . albuterol (PROVENTIL HFA;VENTOLIN HFA) 108 (90 BASE) MCG/ACT inhaler Inhale 2 puffs into the lungs every 6 (six) hours as needed for wheezing or shortness of breath.    04/24/2015 at Unknown time  . Calcium-Vitamin D (CALTRATE 600 PLUS-VIT D PO) Take 1 tablet by mouth every morning. Reported on 02/10/2015   04/24/2015 at Unknown time  . hydrochlorothiazide (HYDRODIURIL) 25 MG tablet Take 25 mg by mouth every  morning.    04/24/2015 at Unknown time  . ipratropium-albuterol (DUONEB) 0.5-2.5 (3) MG/3ML SOLN Take 3 mLs by nebulization 4 (four) times daily as needed (for wheezing/shortness of breath.).    04/24/2015 at Unknown time  . metoprolol succinate (TOPROL-XL) 25 MG 24 hr tablet Take 25 mg by mouth daily. Reported on 02/16/2015   04/24/2015 at 0830  . Multiple Vitamins-Minerals (HAIR/SKIN/NAILS PO) Take 1 tablet by mouth every morning.    04/24/2015 at Unknown time  . nicotine polacrilex (COMMIT) 4 MG lozenge Take 4 mg by mouth as needed for smoking cessation. Reported on 02/10/2015   04/24/2015 at Unknown time  . Omega-3 Fatty Acids (FISH OIL) 1000 MG CAPS Take 1,000 mg by mouth daily.   04/24/2015 at Unknown time  . omeprazole (PRILOSEC) 20 MG capsule Take 20 mg by mouth daily.   04/24/2015 at Unknown time  . predniSONE (DELTASONE) 10 MG tablet Take 5 mg by mouth daily with breakfast.   04/24/2015 at Unknown time    Physical Exam: Blood pressure 111/97, pulse 66, temperature 97.8 F (36.6 C), temperature source Oral, resp. rate 17, height 4\' 11"  (1.499 m), weight 68.04 kg (150 lb), SpO2 98 %.   Wt Readings from Last 1 Encounters:  04/25/15 68.04 kg (150 lb)     General appearance: alert and cooperative Head: Normocephalic, without obvious abnormality, atraumatic Resp: clear to auscultation bilaterally Cardio: regular rate and rhythm GI: soft, non-tender; bowel sounds normal; no masses,  no organomegaly Extremities: extremities normal, atraumatic, no cyanosis or edema Neurologic: Grossly normal  Labs:   Lab Results  Component Value Date   WBC 11.7* 04/25/2015   HGB 10.7* 04/25/2015   HCT 31.8* 04/25/2015   MCV 80.0 04/25/2015   PLT 230 04/25/2015    Recent Labs Lab 04/25/15 0400  NA 140  K 4.4  CL 107  CO2 27  BUN 31*  CREATININE 1.99*  CALCIUM 8.6*  PROT 6.6  BILITOT 0.5  ALKPHOS 55  ALT 24  AST 25  GLUCOSE 93   Lab Results  Component Value Date   TROPONINI 0.36* 04/25/2015       Radiology: No acute cardiopulmonary disease EKG: Initially showed SVT followed by sinus rhythm.  ASSESSMENT AND PLAN:  Patient is a 75 year old female with history of chronic renal insufficiency is COPD with admission for chest pain. She had acute on chronic renal insufficiency with serum creatinine increased to 2.17 on admission which improved back to 1.99 after admission. This appears to be near her baseline. Her serum troponin was mildly elevated to 0.58. EKG showed no ischemia. This was likely secondary to demand ischemia with her renal insufficiency and tachycardia. He appears to be stable at present. She has no further symptoms and is anxious for discharge. She is currently being treated with bronchodilators for COPD. She is on metoprolol titrate 25 mg twice daily when necessary nitrates and prednisone taper. ECHO showed preserved lv funciton. No significant valvular abnormalities.  Would ambulate today and if stable  consider discharge with outpatient follow-up in our office in less than one week. Signed: Teodoro Spray MD, [facility] 04/25/2015, 1:46 PM

## 2015-04-25 NOTE — Progress Notes (Signed)
Per Dr. Manuella Ghazi d/c nitro paste d/t pending discharge. Wilnette Kales

## 2015-04-27 NOTE — Discharge Summary (Signed)
[facility] NAME: Katie Conway    MR#:  [id number]  DATE OF BIRTH:  07/24/1940  DATE OF ADMISSION:  04/24/2015 ADMITTING PHYSICIAN: Idelle Crouch, MD  DATE OF DISCHARGE: 04/25/2015  3:51 PM  PRIMARY CARE PHYSICIAN: Sherrin Daisy, MD    ADMISSION DIAGNOSIS:  SVT (supraventricular tachycardia) (HCC) [I47.1] NSTEMI (non-ST elevated myocardial infarction) ([place]) [I21.4]  DISCHARGE DIAGNOSIS:  Principal Problem:   Chest pain at rest Active Problems:   Chronic kidney disease (CKD), stage III (moderate)   SVT (supraventricular tachycardia) (HCC)   Elevated troponin  SECONDARY DIAGNOSIS:   Past Medical History  Diagnosis Date  . COPD (chronic obstructive pulmonary disease) ([place])   . Renal insufficiency   . Arthritis     Knees  . Wears dentures     Full - upper.  Partial - Lower  . GERD (gastroesophageal reflux disease)   . Diverticulosis   . Shortness of breath dyspnea     with exertion  . Osteoporosis   . Hypertension   . Chronic bronchitis ([place])   . Wears dentures full upper and partial lower    HOSPITAL COURSE:  75 y.o. female has a past medical history significant for COPD, HTN, and CKD with no hx of heart issues admitted for chest tightness with mild SOB.   She was ruled out with serial troponins, cardio c/s was obtained with Dr Ubaldo Glassing.   EKG showed no ischemia. ECHO showed preserved lv funciton. No significant valvular abnormalities.  She remained chest pain free while in the Hospital and was D/C home in stable condition. She was agreeable with D/C plans  DISCHARGE CONDITIONS:   stable  CONSULTS OBTAINED:  Treatment Team:  Teodoro Spray, MD  DRUG ALLERGIES:   Allergies  Allergen Reactions  . Aspirin Other (See Comments)    GI Upset  . Lisinopril Other (See Comments)    Patient states this medication made her very weak.  . Amlodipine Rash and Other (See Comments)    Fatigue     DISCHARGE  MEDICATIONS:   Discharge Medication List as of 04/25/2015  3:19 PM    CONTINUE these medications which have NOT CHANGED   Details  albuterol (PROVENTIL HFA;VENTOLIN HFA) 108 (90 BASE) MCG/ACT inhaler Inhale 2 puffs into the lungs every 6 (six) hours as needed for wheezing or shortness of breath. , Until Discontinued, Historical Med    Calcium-Vitamin D (CALTRATE 600 PLUS-VIT D PO) Take 1 tablet by mouth every morning. Reported on 02/10/2015, Until Discontinued, Historical Med    hydrochlorothiazide (HYDRODIURIL) 25 MG tablet Take 25 mg by mouth every morning. , Until Discontinued, Historical Med    ipratropium-albuterol (DUONEB) 0.5-2.5 (3) MG/3ML SOLN Take 3 mLs by nebulization 4 (four) times daily as needed (for wheezing/shortness of breath.). , Until Discontinued, Historical Med    metoprolol succinate (TOPROL-XL) 25 MG 24 hr tablet Take 25 mg by mouth daily. Reported on 02/16/2015, Until Discontinued, Historical Med    Multiple Vitamins-Minerals (HAIR/SKIN/NAILS PO) Take 1 tablet by mouth every morning. , Until Discontinued, Historical Med    nicotine polacrilex (COMMIT) 4 MG lozenge Take 4 mg by mouth as needed for smoking cessation. Reported on 02/10/2015, Until Discontinued, Historical Med    Omega-3 Fatty Acids (FISH OIL) 1000 MG CAPS Take 1,000 mg by mouth daily., Until Discontinued, Historical Med    omeprazole (PRILOSEC) 20 MG capsule Take 20 mg by mouth daily., Until Discontinued, Historical Med  predniSONE (DELTASONE) 10 MG tablet Take 5 mg by mouth daily with breakfast., Until Discontinued, Historical Med         DISCHARGE INSTRUCTIONS:    DIET:  Regular diet  DISCHARGE CONDITION:  Good  ACTIVITY:  Activity as tolerated  OXYGEN:  Home Oxygen: No.   Oxygen Delivery: room air  DISCHARGE LOCATION:  home   If you experience worsening of your admission symptoms, develop shortness of breath, life threatening emergency, suicidal or homicidal thoughts you must  seek medical attention immediately by calling 911 or calling your MD immediately  if symptoms less severe.  You Must read complete instructions/literature along with all the possible adverse reactions/side effects for all the Medicines you take and that have been prescribed to you. Take any new Medicines after you have completely understood and accpet all the possible adverse reactions/side effects.   Please note  You were cared for by a hospitalist during your hospital stay. If you have any questions about your discharge medications or the care you received while you were in the hospital after you are discharged, you can call the unit and asked to speak with the hospitalist on call if the hospitalist that took care of you is not available. Once you are discharged, your primary care physician will handle any further medical issues. Please note that NO REFILLS for any discharge medications will be authorized once you are discharged, as it is imperative that you return to your primary care physician (or establish a relationship with a primary care physician if you do not have one) for your aftercare needs so that they can reassess your need for medications and monitor your lab values.    On the day of Discharge:  VITAL SIGNS:  Blood pressure 111/97, pulse 66, temperature 97.8 F (36.6 C), temperature source Oral, resp. rate 17, height 4\' 11"  (1.499 m), weight 68.04 kg (150 lb), SpO2 98 %.  PHYSICAL EXAMINATION:  GENERAL:  75 y.o.-year-old patient lying in the bed with no acute distress.  EYES: Pupils equal, round, reactive to light and accommodation. No scleral icterus. Extraocular muscles intact.  HEENT: Head atraumatic, normocephalic. Oropharynx and nasopharynx clear.  NECK:  Supple, no jugular venous distention. No thyroid enlargement, no tenderness.  LUNGS: Normal breath sounds bilaterally, no wheezing, rales,rhonchi or crepitation. No use of accessory muscles of respiration.  CARDIOVASCULAR:  S1, S2 normal. No murmurs, rubs, or gallops.  ABDOMEN: Soft, non-tender, non-distended. Bowel sounds present. No organomegaly or mass.  EXTREMITIES: No pedal edema, cyanosis, or clubbing.  NEUROLOGIC: Cranial nerves II through XII are intact. Muscle strength 5/5 in all extremities. Sensation intact. Gait not checked.  PSYCHIATRIC: The patient is alert and oriented x 3.  SKIN: No obvious rash, lesion, or ulcer.  DATA REVIEW:   CBC  Recent Labs Lab 04/25/15 0400  WBC 11.7*  HGB 10.7*  HCT 31.8*  PLT 230    Chemistries   Recent Labs Lab 04/24/15 1051 04/25/15 0400  NA 137 140  K 3.9 4.4  CL 103 107  CO2 28 27  GLUCOSE 142* 93  BUN 29* 31*  CREATININE 2.17* 1.99*  CALCIUM 9.1 8.6*  MG 1.6*  --   AST 28 25  ALT 27 24  ALKPHOS 56 55  BILITOT 0.8 0.5    Cardiac Enzymes  Recent Labs Lab 04/25/15 0400  TROPONINI 0.36*    Management plans discussed with the patient, family and they are in agreement.  CODE STATUS:  Code Status History  Date Active Date Inactive Code Status Order ID Comments User Context   04/24/2015  4:08 PM 04/25/2015  6:52 PM Full Code [id number]  Idelle Crouch, MD Inpatient      TOTAL TIME TAKING CARE OF THIS PATIENT: 45 minutes.    [facility], Alyson Ki M.D on 04/27/2015 at 6:22 PM  Between 7am to 6pm - Pager - [phone]  After 6pm go to [email or web address] - password [email or web address] [facility]  Office  [phone]  CC: Primary care physician; Sherrin Daisy, MD   Note: This dictation was prepared with Dragon dictation along with smaller phrase technology. Any transcriptional errors that result from this process are unintentional.

## 2015-06-03 ENCOUNTER — Other Ambulatory Visit: Payer: Self-pay | Admitting: Internal Medicine

## 2015-06-03 DIAGNOSIS — R059 Cough, unspecified: Secondary | ICD-10-CM

## 2015-06-03 DIAGNOSIS — R05 Cough: Secondary | ICD-10-CM

## 2015-12-20 ENCOUNTER — Other Ambulatory Visit: Payer: Self-pay | Admitting: Family Medicine

## 2015-12-20 DIAGNOSIS — Z1231 Encounter for screening mammogram for malignant neoplasm of breast: Secondary | ICD-10-CM

## 2015-12-27 ENCOUNTER — Ambulatory Visit
Admission: RE | Admit: 2015-12-27 | Discharge: 2015-12-27 | Disposition: A | Discharge status: Home / Self Care | Payer: Medicare HMO | Source: Ambulatory Visit | Attending: Family Medicine | Admitting: Family Medicine

## 2015-12-27 DIAGNOSIS — Z1231 Encounter for screening mammogram for malignant neoplasm of breast: Secondary | ICD-10-CM

## 2016-02-21 ENCOUNTER — Ambulatory Visit
Admission: EM | Admit: 2016-02-21 | Discharge: 2016-02-21 | Disposition: A | Discharge status: Home / Self Care | Payer: Medicare HMO | Attending: Family Medicine | Admitting: Family Medicine

## 2016-02-21 DIAGNOSIS — S39012A Strain of muscle, fascia and tendon of lower back, initial encounter: Secondary | ICD-10-CM | POA: Diagnosis not present

## 2016-02-21 MED ORDER — HYDROCODONE-ACETAMINOPHEN 5-325 MG PO TABS: ORAL_TABLET | ORAL | 0 refills | Status: DC

## 2016-02-21 NOTE — ED Triage Notes (Signed)
Patient complains of lower back pain that started this morning. Patient states that she was making the bed this morning and bent over and she had a sharp pain that made her scream.

## 2016-02-21 NOTE — ED Provider Notes (Signed)
[facility]    CSN: [id number] Arrival date & time: 02/21/16  1205     History   Chief Complaint Chief Complaint  Patient presents with  . Back Pain    HPI Katie Conway is a 76 y.o. female.    Back Pain  Location:  Lumbar spine Quality:  Stabbing Radiates to:  Does not radiate Pain severity:  Moderate Onset quality:  Sudden Duration:  6 hours Timing:  Constant Progression:  Improving Chronicity:  New Context: twisting (and bending this morning)   Relieved by:  None tried Ineffective treatments:  None tried Associated symptoms: no abdominal pain, no abdominal swelling, no bladder incontinence, no bowel incontinence, no fever, no leg pain, no numbness, no paresthesias, no pelvic pain, no perianal numbness, no tingling and no weakness   Risk factors: no recent surgery     Past Medical History:  Diagnosis Date  . Arthritis    Knees  . Chronic bronchitis ([place])   . COPD (chronic obstructive pulmonary disease) ([place])   . Diverticulosis   . GERD (gastroesophageal reflux disease)   . Hypertension   . Osteoporosis   . Renal insufficiency   . Shortness of breath dyspnea    with exertion  . Wears dentures    Full - upper.  Partial - Lower  . Wears dentures full upper and partial lower    Patient Active Problem List   Diagnosis Date Noted  . Chest pain at rest 04/24/2015  . SVT (supraventricular tachycardia) ([place]) 04/24/2015  . Elevated troponin 04/24/2015  . Acid reflux 09/22/2014  . BP (high blood pressure) 09/22/2014  . Leukocytosis 09/22/2014  . Cataract 06/08/2014  . CAFL (chronic airflow limitation) ([place]) 03/22/2014  . Hypercholesterolemia without hypertriglyceridemia 11/27/2013  . Avitaminosis D 08/31/2013  . Chronic kidney disease (CKD), stage III (moderate) 05/16/2011  . Essential (primary) hypertension 05/16/2011    Past Surgical History:  Procedure Laterality Date  . CATARACT EXTRACTION W/PHACO Left 06/02/2014   Procedure:  CATARACT EXTRACTION PHACO AND INTRAOCULAR LENS PLACEMENT (IOC);  Surgeon: Leandrew Koyanagi, MD;  Location: [place];  Service: Ophthalmology;  Laterality: Left;  . CATARACT EXTRACTION W/PHACO Right 02/16/2015   Procedure: CATARACT EXTRACTION PHACO AND INTRAOCULAR LENS PLACEMENT (IOC);  Surgeon: Leandrew Koyanagi, MD;  Location: [place];  Service: Ophthalmology;  Laterality: Right;  . CESAREAN SECTION      OB History    No data available       Home Medications    Prior to Admission medications   Medication Sig Start Date End Date Taking? Authorizing Provider  albuterol (PROVENTIL HFA;VENTOLIN HFA) 108 (90 BASE) MCG/ACT inhaler Inhale 2 puffs into the lungs every 6 (six) hours as needed for wheezing or shortness of breath.    Yes Historical Provider, MD  Calcium-Vitamin D (CALTRATE 600 PLUS-VIT D PO) Take 1 tablet by mouth every morning. Reported on 02/10/2015   Yes Historical Provider, MD  hydrochlorothiazide (HYDRODIURIL) 25 MG tablet Take 25 mg by mouth every morning.    Yes Historical Provider, MD  ipratropium-albuterol (DUONEB) 0.5-2.5 (3) MG/3ML SOLN Take 3 mLs by nebulization 4 (four) times daily as needed (for wheezing/shortness of breath.).    Yes Historical Provider, MD  metoprolol succinate (TOPROL-XL) 25 MG 24 hr tablet Take 25 mg by mouth daily. Reported on 02/16/2015   Yes Historical Provider, MD  Multiple Vitamins-Minerals (HAIR/SKIN/NAILS PO) Take 1 tablet by mouth every morning.    Yes Historical Provider, MD  nicotine polacrilex (COMMIT) 4 MG  lozenge Take 4 mg by mouth as needed for smoking cessation. Reported on 02/10/2015   Yes Historical Provider, MD  Omega-3 Fatty Acids (FISH OIL) 1000 MG CAPS Take 1,000 mg by mouth daily.   Yes Historical Provider, MD  omeprazole (PRILOSEC) 20 MG capsule Take 20 mg by mouth daily.   Yes Historical Provider, MD  predniSONE (DELTASONE) 10 MG tablet Take 5 mg by mouth daily with breakfast.   Yes Historical Provider,  MD  HYDROcodone-acetaminophen (NORCO/VICODIN) 5-325 MG tablet 1-2 tabs po qhs prn 02/21/16   Norval Gable, MD    Family History Family History  Problem Relation Age of Onset  . Heart failure Mother   . Heart attack Father   . COPD Brother   . Kidney failure Brother   . Breast cancer Other 30    Social History Social History  Substance Use Topics  . Smoking status: Former Smoker    Types: Cigarettes    Quit date: 01/23/2011  . Smokeless tobacco: Never Used     Comment: Previous smoker; 1 pack a day for 50 years...  . Alcohol use No     Comment: glass wine 2-3 times per year     Allergies   Amoxicillin; Aspirin; Lisinopril; and Amlodipine   Review of Systems Review of Systems  Constitutional: Negative for fever.  Gastrointestinal: Negative for abdominal pain and bowel incontinence.  Genitourinary: Negative for bladder incontinence and pelvic pain.  Musculoskeletal: Positive for back pain.  Neurological: Negative for tingling, weakness, numbness and paresthesias.     Physical Exam Triage Vital Signs ED Triage Vitals  Enc Vitals Group     BP 02/21/16 1241 (!) 113/97     Pulse Rate 02/21/16 1241 (!) 109     Resp 02/21/16 1241 17     Temp 02/21/16 1241 98.2 F (36.8 C)     Temp Source 02/21/16 1241 Oral     SpO2 02/21/16 1241 99 %     Weight 02/21/16 1239 150 lb (68 kg)     Height 02/21/16 1239 4\' 8"  (1.422 m)     Head Circumference --      Peak Flow --      Pain Score 02/21/16 1241 8     Pain Loc --      Pain Edu? --      Excl. in [place]? --    No data found.   Updated Vital Signs BP (!) 113/97 (BP Location: Left Arm)   Pulse (!) 109   Temp 98.2 F (36.8 C) (Oral)   Resp 17   Ht 4\' 8"  (1.422 m)   Wt 150 lb (68 kg)   SpO2 99%   BMI 33.63 kg/m   Visual Acuity Right Eye Distance:   Left Eye Distance:   Bilateral Distance:    Right Eye Near:   Left Eye Near:    Bilateral Near:     Physical Exam  Constitutional: She appears well-developed and  well-nourished. No distress.  Musculoskeletal: She exhibits tenderness. She exhibits no edema.       Lumbar back: She exhibits tenderness (over the lumbar paraspinous muscles bilaterally) and spasm. She exhibits normal range of motion, no bony tenderness, no swelling, no edema, no deformity, no laceration, no pain and normal pulse.  Neurological: She is alert. She has normal reflexes. She displays normal reflexes. She exhibits normal muscle tone.  Skin: Skin is warm and dry. No rash noted. She is not diaphoretic. No erythema.  Nursing note and vitals reviewed.  UC Treatments / Results  Labs (all labs ordered are listed, but only abnormal results are displayed) Labs Reviewed - No data to display  EKG  EKG Interpretation None       Radiology No results found.  Procedures Procedures (including critical care time)  Medications Ordered in UC Medications - No data to display   Initial Impression / Assessment and Plan / UC Course  I have reviewed the triage vital signs and the nursing notes.  Pertinent labs & imaging results that were available during my care of the patient were reviewed by me and considered in my medical decision making (see chart for details).       Final Clinical Impressions(s) / UC Diagnoses   Final diagnoses:  Strain of lumbar region, initial encounter    New Prescriptions Discharge Medication List as of 02/21/2016  1:39 PM    START taking these medications   Details  HYDROcodone-acetaminophen (NORCO/VICODIN) 5-325 MG tablet 1-2 tabs po qhs prn, Print       1.diagnosis reviewed with patient 2. rx as per orders above; reviewed possible side effects, interactions, risks and benefits  3. Recommend supportive treatment with rest, ice/heat 4. Follow-up prn if symptoms worsen or don't improve   Norval Gable, MD 02/21/16 1353

## 2016-03-24 ENCOUNTER — Ambulatory Visit
Admission: EM | Admit: 2016-03-24 | Discharge: 2016-03-24 | Disposition: A | Discharge status: Home / Self Care | Payer: Medicare HMO | Attending: Family Medicine | Admitting: Family Medicine

## 2016-03-24 DIAGNOSIS — R05 Cough: Secondary | ICD-10-CM

## 2016-03-24 DIAGNOSIS — J209 Acute bronchitis, unspecified: Secondary | ICD-10-CM | POA: Diagnosis not present

## 2016-03-24 DIAGNOSIS — J440 Chronic obstructive pulmonary disease with acute lower respiratory infection: Secondary | ICD-10-CM

## 2016-03-24 DIAGNOSIS — R059 Cough, unspecified: Secondary | ICD-10-CM

## 2016-03-24 MED ORDER — DOXYCYCLINE HYCLATE 100 MG PO CAPS: 100.0000 mg | ORAL_CAPSULE | Freq: Two times a day (BID) | ORAL | 0 refills | Status: AC

## 2016-03-24 NOTE — Discharge Instructions (Signed)
Recommend start Doxycycline 100mg  twice a day as directed. Use OTC Delsym cough syrup 2 teaspoons twice a day as needed for cough. Continue all inhalers as directed and use nebulizer if needed. Follow up with Dr. Raul Del in 3 to 4 days if not improving.

## 2016-03-24 NOTE — ED Provider Notes (Signed)
CSN: [id number]     Arrival date & time 03/24/16  0917 History   First MD Initiated Contact with Patient 03/24/16 [phone]     Chief Complaint  Patient presents with  . Cough   (Consider location/radiation/quality/duration/timing/severity/associated sxs/prior Treatment) 76 year old female presents with persistent cough for the past 5 to 6 weeks. Has become more severe in the past few days with coughing up slight yellow to white sputum. Having slight nasal congestion and sinus pressure. Denies any fever or GI symptoms. Has history of COPD and currently on Breo, Albuterol and DuoNeb nebulizer with minimal relief. Has also taken Mucinex and Robitussin with no relief. Called her pulmonologist yesterday and was prescribed Tessalon cough pills which have made her cough worse. She was just taken off Prednisone. She has an appointment next month for routine check up and chest x-ray with her Pulmonologist. She quit smoking 5 years ago but still uses Nicotine lozenges daily. She is in no acute distress.    The history is provided by the patient.    Past Medical History:  Diagnosis Date  . Arthritis    Knees  . Chronic bronchitis ([place])   . COPD (chronic obstructive pulmonary disease) ([place])   . Diverticulosis   . GERD (gastroesophageal reflux disease)   . Hypertension   . Osteoporosis   . Renal insufficiency   . Shortness of breath dyspnea    with exertion  . Wears dentures    Full - upper.  Partial - Lower  . Wears dentures full upper and partial lower   Past Surgical History:  Procedure Laterality Date  . CATARACT EXTRACTION W/PHACO Left 06/02/2014   Procedure: CATARACT EXTRACTION PHACO AND INTRAOCULAR LENS PLACEMENT (IOC);  Surgeon: Leandrew Koyanagi, MD;  Location: [place];  Service: Ophthalmology;  Laterality: Left;  . CATARACT EXTRACTION W/PHACO Right 02/16/2015   Procedure: CATARACT EXTRACTION PHACO AND INTRAOCULAR LENS PLACEMENT (IOC);  Surgeon: Leandrew Koyanagi, MD;   Location: [place];  Service: Ophthalmology;  Laterality: Right;  . CESAREAN SECTION     Family History  Problem Relation Age of Onset  . Heart failure Mother   . Heart attack Father   . COPD Brother   . Kidney failure Brother   . Breast cancer Other 30   Social History  Substance Use Topics  . Smoking status: Former Smoker    Types: Cigarettes    Quit date: 01/23/2011  . Smokeless tobacco: Never Used     Comment: Previous smoker; 1 pack a day for 50 years...  . Alcohol use No     Comment: glass wine 2-3 times per year   OB History    No data available     Review of Systems  Constitutional: Negative for appetite change, chills, fatigue, fever and unexpected weight change.  HENT: Positive for congestion, postnasal drip, sinus pressure and sore throat. Negative for ear pain, rhinorrhea and sinus pain.   Eyes: Negative for discharge.  Respiratory: Positive for cough, chest tightness and wheezing. Negative for shortness of breath.   Cardiovascular: Negative for chest pain.  Gastrointestinal: Negative for abdominal pain, diarrhea, nausea and vomiting.  Musculoskeletal: Negative for arthralgias, myalgias, neck pain and neck stiffness.  Skin: Negative for rash and wound.  Neurological: Positive for headaches. Negative for dizziness, syncope, weakness and light-headedness.  Hematological: Negative for adenopathy.    Allergies  Singulair [montelukast sodium]; Amoxicillin; Aspirin; Lisinopril; and Amlodipine  Home Medications   Prior to Admission medications   Medication Sig Start  Date End Date Taking? Authorizing Provider  albuterol (PROVENTIL HFA;VENTOLIN HFA) 108 (90 BASE) MCG/ACT inhaler Inhale 2 puffs into the lungs every 6 (six) hours as needed for wheezing or shortness of breath.    Yes Historical Provider, MD  benzonatate (TESSALON) 100 MG capsule Take by mouth 3 (three) times daily as needed for cough.   Yes Historical Provider, MD  Calcium-Vitamin D (CALTRATE  600 PLUS-VIT D PO) Take 1 tablet by mouth every morning. Reported on 02/10/2015   Yes Historical Provider, MD  hydrochlorothiazide (HYDRODIURIL) 25 MG tablet Take 25 mg by mouth every morning.    Yes Historical Provider, MD  HYDROcodone-acetaminophen (NORCO/VICODIN) 5-325 MG tablet 1-2 tabs po qhs prn 02/21/16  Yes Norval Gable, MD  ipratropium-albuterol (DUONEB) 0.5-2.5 (3) MG/3ML SOLN Take 3 mLs by nebulization 4 (four) times daily as needed (for wheezing/shortness of breath.).    Yes Historical Provider, MD  metoprolol succinate (TOPROL-XL) 25 MG 24 hr tablet Take 25 mg by mouth daily. Reported on 02/16/2015   Yes Historical Provider, MD  Multiple Vitamins-Minerals (HAIR/SKIN/NAILS PO) Take 1 tablet by mouth every morning.    Yes Historical Provider, MD  nicotine polacrilex (COMMIT) 4 MG lozenge Take 4 mg by mouth as needed for smoking cessation. Reported on 02/10/2015   Yes Historical Provider, MD  Omega-3 Fatty Acids (FISH OIL) 1000 MG CAPS Take 1,000 mg by mouth daily.   Yes Historical Provider, MD  omeprazole (PRILOSEC) 20 MG capsule Take 20 mg by mouth daily.   Yes Historical Provider, MD  doxycycline (VIBRAMYCIN) 100 MG capsule Take 1 capsule (100 mg total) by mouth 2 (two) times daily. 03/24/16 03/31/16  Katy Apo, NP   Meds Ordered and Administered this Visit  Medications - No data to display  BP (!) 126/97 (BP Location: Left Arm)   Pulse 80   Temp 97.5 F (36.4 C) (Oral)   Resp 16   Ht 4\' 8"  (1.422 m)   Wt 150 lb (68 kg)   SpO2 99%   BMI 33.63 kg/m  No data found.   Physical Exam  Constitutional: She is oriented to person, place, and time. She appears well-developed and well-nourished. No distress.  HENT:  Head: Normocephalic and atraumatic.  Right Ear: Hearing, tympanic membrane, external ear and ear canal normal.  Left Ear: Hearing, tympanic membrane, external ear and ear canal normal.  Nose: Rhinorrhea present. Right sinus exhibits maxillary sinus tenderness. Right  sinus exhibits no frontal sinus tenderness. Left sinus exhibits maxillary sinus tenderness. Left sinus exhibits no frontal sinus tenderness.  Mouth/Throat: Uvula is midline and mucous membranes are normal. Posterior oropharyngeal erythema present.  Neck: Normal range of motion. Neck supple.  Cardiovascular: Normal rate, regular rhythm and normal heart sounds.   Pulmonary/Chest: Effort normal. She has decreased breath sounds in the right upper field, the right lower field, the left upper field and the left lower field. She has wheezes in the right upper field and the left upper field. She has rhonchi in the right upper field, the right lower field, the left upper field and the left lower field. She has no rales.  Mild wheezes in the upper lobes. Slight decrease breath sounds in all fields.   Lymphadenopathy:    She has no cervical adenopathy.  Neurological: She is alert and oriented to person, place, and time.  Skin: Skin is warm and dry. Capillary refill takes less than 2 seconds.  Psychiatric: She has a normal mood and affect. Her behavior is normal. Judgment  and thought content normal.    Urgent Care Course     Procedures (including critical care time)  Labs Review Labs Reviewed - No data to display  Imaging Review No results found.   Visual Acuity Review  Right Eye Distance:   Left Eye Distance:   Bilateral Distance:    Right Eye Near:   Left Eye Near:    Bilateral Near:         MDM   1. COPD with acute bronchitis ([place])   2. Cough    Offered chest x-ray but patient declined. She wants to see her Pulmolonist as planned for a chest x-ray in the next month. Recommend start Doxycycline 100mg  twice a day as directed. Use OTC Delsym cough syrup 2 teaspoons twice a day as needed for cough. Continue all inhalers as directed and use nebulizer if needed.  Follow up with Dr. Raul Del in 3 to 4 days if not improving or go to ER if symptoms worsen.       Katy Apo,  NP 03/24/16 1031

## 2016-03-24 NOTE — ED Triage Notes (Signed)
Onset 5-6 week dry hacking cough

## 2016-04-05 ENCOUNTER — Encounter: Payer: Self-pay | Admitting: Family Medicine

## 2016-04-05 ENCOUNTER — Ambulatory Visit (INDEPENDENT_AMBULATORY_CARE_PROVIDER_SITE_OTHER): Payer: Medicare HMO | Admitting: Family Medicine

## 2016-04-05 VITALS — BP 158/90 | HR 81 | Temp 98.1°F | Resp 16 | Ht <= 58 in | Wt 142.0 lb

## 2016-04-05 DIAGNOSIS — Z23 Encounter for immunization: Secondary | ICD-10-CM

## 2016-04-05 DIAGNOSIS — R739 Hyperglycemia, unspecified: Secondary | ICD-10-CM | POA: Diagnosis not present

## 2016-04-05 DIAGNOSIS — N183 Chronic kidney disease, stage 3 unspecified: Secondary | ICD-10-CM

## 2016-04-05 DIAGNOSIS — J441 Chronic obstructive pulmonary disease with (acute) exacerbation: Secondary | ICD-10-CM

## 2016-04-05 DIAGNOSIS — I10 Essential (primary) hypertension: Secondary | ICD-10-CM

## 2016-04-05 DIAGNOSIS — E559 Vitamin D deficiency, unspecified: Secondary | ICD-10-CM

## 2016-04-05 DIAGNOSIS — E669 Obesity, unspecified: Secondary | ICD-10-CM | POA: Diagnosis not present

## 2016-04-05 DIAGNOSIS — K219 Gastro-esophageal reflux disease without esophagitis: Secondary | ICD-10-CM

## 2016-04-05 DIAGNOSIS — D649 Anemia, unspecified: Secondary | ICD-10-CM | POA: Diagnosis not present

## 2016-04-05 NOTE — Patient Instructions (Signed)
Take prednisone 10 mg twice daily for 5 days. Call if your symptoms worsen or persist.

## 2016-04-06 ENCOUNTER — Other Ambulatory Visit: Payer: Self-pay | Admitting: Family Medicine

## 2016-04-06 ENCOUNTER — Other Ambulatory Visit: Payer: Self-pay

## 2016-04-06 DIAGNOSIS — D509 Iron deficiency anemia, unspecified: Secondary | ICD-10-CM

## 2016-04-06 LAB — CBC
Hematocrit: 33.5 % — ABNORMAL LOW (ref 34.0–46.6)
Hemoglobin: 10.5 g/dL — ABNORMAL LOW (ref 11.1–15.9)
MCH: 25.8 pg — ABNORMAL LOW (ref 26.6–33.0)
MCHC: 31.3 g/dL — ABNORMAL LOW (ref 31.5–35.7)
MCV: 82 fL (ref 79–97)
PLATELETS: 266 10*3/uL (ref 150–379)
RBC: 4.07 x10E6/uL (ref 3.77–5.28)
RDW: 13.8 % (ref 12.3–15.4)
WBC: 11.4 10*3/uL — AB (ref 3.4–10.8)

## 2016-04-06 LAB — HEMOGLOBIN A1C
Est. average glucose Bld gHb Est-mCnc: 126 mg/dL
HEMOGLOBIN A1C: 6 % — AB (ref 4.8–5.6)

## 2016-04-06 LAB — COMPREHENSIVE METABOLIC PANEL
ALBUMIN: 3.5 g/dL (ref 3.5–4.8)
ALT: 29 IU/L (ref 0–32)
AST: 33 IU/L (ref 0–40)
Albumin/Globulin Ratio: 1.1 — ABNORMAL LOW (ref 1.2–2.2)
Alkaline Phosphatase: 79 IU/L (ref 39–117)
BUN / CREAT RATIO: 13 (ref 12–28)
BUN: 23 mg/dL (ref 8–27)
Bilirubin Total: 0.3 mg/dL (ref 0.0–1.2)
CALCIUM: 9.4 mg/dL (ref 8.7–10.3)
CO2: 22 mmol/L (ref 18–29)
Chloride: 102 mmol/L (ref 96–106)
Creatinine, Ser: 1.8 mg/dL — ABNORMAL HIGH (ref 0.57–1.00)
GFR, EST AFRICAN AMERICAN: 32 mL/min/{1.73_m2} — AB (ref >59)
GFR, EST NON AFRICAN AMERICAN: 27 mL/min/{1.73_m2} — AB (ref >59)
GLOBULIN, TOTAL: 3.1 g/dL (ref 1.5–4.5)
Glucose: 91 mg/dL (ref 65–99)
Potassium: 4.8 mmol/L (ref 3.5–5.2)
SODIUM: 140 mmol/L (ref 134–144)
TOTAL PROTEIN: 6.6 g/dL (ref 6.0–8.5)

## 2016-04-06 LAB — VITAMIN D 25 HYDROXY (VIT D DEFICIENCY, FRACTURES): Vit D, 25-Hydroxy: 42.7 ng/mL (ref 30.0–100.0)

## 2016-04-06 LAB — TSH: TSH: 0.511 u[IU]/mL (ref 0.450–4.500)

## 2016-04-06 LAB — VITAMIN B12

## 2016-04-06 MED ORDER — LOSARTAN POTASSIUM 50 MG PO TABS: 50.0000 mg | ORAL_TABLET | Freq: Every day | ORAL | 2 refills | Status: DC

## 2016-04-06 NOTE — Progress Notes (Signed)
Date:  04/05/2016   Name:  Katie Conway   DOB:  [birth year]   MRN:  [id number]  PCP:  Adline Potter, MD    Chief Complaint: Establish Care and Cough (6 weeks of cough hx COPD- Finished Abx and steroid and is doing Nebulizer as well as inhalers. )   History of Present Illness:  This is a 76 y.o. female seen for initial visit. Known COPD on Breo/albuterol, c/o cough past 6wks, usually productive but not today, saw Dr. Raul Del one month ago, no med changes made, saw [facility] 2 weeks ago, placed on doxy which has not helped, using nebs 6 times daily, just not getting better. Taken off prednisone 10 mg daily by Dr. Raul Del last month. HTN on metoprolol, taken off HCTZ in Jan for orthostatic hypotension. Amlodipine causes rash, lisinopril made feel weak. Takes B12 supp for energy, ca/vit D for prevention, vitamin for hair, Zantac daily for heartburn, never tried to stop. Hx leukocytosis seen heme 2016, reassured. Has also sent cards for SVT/HLD, nephro follows for CKD3. Glucose 122, Hb 11.2 in January 28, 2023. Father died MI 68, mother died CHF 47. Sibs have died of kidney dz and COPD. Tet/pneumo/shingles status unknown.  Review of Systems:  Review of Systems  Constitutional: Negative for chills and fever.  HENT: Negative for ear pain and sore throat.   Eyes: Negative for pain.  Respiratory: Negative for shortness of breath.   Cardiovascular: Negative for chest pain and leg swelling.  Gastrointestinal: Negative for abdominal pain.  Endocrine: Negative for polydipsia and polyuria.  Genitourinary: Negative for difficulty urinating.  Neurological: Negative for tremors, syncope and light-headedness.    Patient Active Problem List   Diagnosis Date Noted  . Anemia 04/05/2016  . Obesity (BMI 30.0-34.9) 04/05/2016  . Chest pain at rest 04/24/2015  . SVT (supraventricular tachycardia) ([place]) 04/24/2015  . Elevated troponin 04/24/2015  . Acid reflux 09/22/2014  . Leukocytosis 09/22/2014  . Cataract  06/08/2014  . CAFL (chronic airflow limitation) ([place]) 03/22/2014  . Hypercholesterolemia without hypertriglyceridemia 11/27/2013  . Vitamin D deficiency 08/31/2013  . Chronic kidney disease (CKD), stage III (moderate) 05/16/2011  . Essential (primary) hypertension 05/16/2011    Prior to Admission medications   Medication Sig Start Date End Date Taking? Authorizing Provider  albuterol (PROVENTIL HFA;VENTOLIN HFA) 108 (90 Base) MCG/ACT inhaler Inhale into the lungs. 12/22/14  Yes Historical Provider, MD  Calcium-Vitamin D (CALTRATE 600 PLUS-VIT D PO) Take 1 tablet by mouth every morning. Reported on 02/10/2015   Yes Historical Provider, MD  Cyanocobalamin (VITAMIN B-12) 2500 MCG SUBL Place under the tongue.   Yes Historical Provider, MD  Fluticasone Furoate-Vilanterol (BREO ELLIPTA IN) Inhale into the lungs.   Yes Historical Provider, MD  ipratropium-albuterol (DUONEB) 0.5-2.5 (3) MG/3ML SOLN Take 3 mLs by nebulization 4 (four) times daily as needed (for wheezing/shortness of breath.).    Yes Historical Provider, MD  metoprolol succinate (TOPROL-XL) 25 MG 24 hr tablet Take 25 mg by mouth daily. Reported on 02/16/2015   Yes Historical Provider, MD  montelukast (SINGULAIR) 10 MG tablet Take by mouth. 08/16/15 08/15/16 Yes Historical Provider, MD  Omega-3 Fatty Acids (FISH OIL) 1000 MG CAPS Take 1,000 mg by mouth daily.   Yes Historical Provider, MD  ranitidine (ZANTAC) 150 MG tablet Take by mouth.   Yes Historical Provider, MD    Allergies  Allergen Reactions  . Amoxicillin Other (See Comments)    Hair loss Hair loss  . Aspirin Other (See Comments)    GI Upset  .  Lisinopril Other (See Comments)    Patient states this medication made her very weak.  . Amlodipine Rash and Other (See Comments)    Fatigue     Past Surgical History:  Procedure Laterality Date  . CATARACT EXTRACTION W/PHACO Left 06/02/2014   Procedure: CATARACT EXTRACTION PHACO AND INTRAOCULAR LENS PLACEMENT (IOC);  Surgeon:  Leandrew Koyanagi, MD;  Location: [place];  Service: Ophthalmology;  Laterality: Left;  . CATARACT EXTRACTION W/PHACO Right 02/16/2015   Procedure: CATARACT EXTRACTION PHACO AND INTRAOCULAR LENS PLACEMENT (IOC);  Surgeon: Leandrew Koyanagi, MD;  Location: [place];  Service: Ophthalmology;  Laterality: Right;  . CESAREAN SECTION      Social History  Substance Use Topics  . Smoking status: Former Smoker    Types: Cigarettes    Quit date: 01/23/2011  . Smokeless tobacco: Never Used     Comment: Previous smoker; 1 pack a day for 50 years...  . Alcohol use No     Comment: glass wine 2-3 times per year    Family History  Problem Relation Age of Onset  . Heart failure Mother   . Heart attack Father   . COPD Brother   . Kidney failure Brother   . Breast cancer Other 30    Medication list has been reviewed and updated.  Physical Examination: BP (!) 158/90   Pulse 81   Temp 98.1 F (36.7 C)   Resp 16   Ht 4\' 8"  (1.422 m)   Wt 142 lb (64.4 kg)   SpO2 99%   BMI 31.84 kg/m   Physical Exam  Constitutional: She is oriented to person, place, and time. She appears well-developed and well-nourished.  HENT:  Head: Normocephalic and atraumatic.  Right Ear: External ear normal.  Left Ear: External ear normal.  Nose: Nose normal.  Mouth/Throat: Oropharynx is clear and moist.  TM's clear  Eyes: Conjunctivae and EOM are normal. Pupils are equal, round, and reactive to light.  Neck: Neck supple. No thyromegaly present.  Cardiovascular: Normal rate, regular rhythm, normal heart sounds and intact distal pulses.   Pulmonary/Chest: Effort normal and breath sounds normal.  Abdominal: Soft. She exhibits no distension and no mass. There is no tenderness.  Musculoskeletal: She exhibits no edema.  Lymphadenopathy:    She has no cervical adenopathy.  Neurological: She is alert and oriented to person, place, and time. Coordination normal.  Romberg negative, gait normal   Skin: Skin is warm and dry.  Psychiatric: She has a normal mood and affect. Her behavior is normal.  Nursing note and vitals reviewed.   Assessment and Plan:  1. Chronic obstructive pulmonary disease with acute exacerbation (HCC) Restart prednisone 10 mg bid x 5d, call if sxs worsen/persist, consider f/u Dr. Raul Del then  2. Essential (primary) hypertension Poor control on metoprolol, off HCTZ, consider increased metoprolol dose or ARB if still elevated next visit - Comprehensive Metabolic Panel (CMET)  3. Chronic kidney disease (CKD), stage III (moderate) Followed by nephro, avoiding NSAIDS  4. Gastroesophageal reflux disease, esophagitis presence not specified Well controlled on daily Zantac, consider cessation trial in future  5. Avitaminosis D On supplement - Vitamin D (25 hydroxy)  6. Anemia, unspecified type - CBC - B12  7. Obesity (BMI 30.0-34.9) - TSH  8. Hyperglycemia - HgB A1c  9. Need for diphtheria-tetanus-pertussis (Tdap) vaccine - Tdap vaccine greater than or equal to 7yo IM  10. Need for pneumococcal vaccination - Pneumococcal conjugate vaccine 13-valent  Return in about 4 weeks (around  05/03/2016).   45 mins spent with pt over half in counseling  [name]. [facility]  04/06/2016

## 2016-04-10 ENCOUNTER — Encounter: Payer: Self-pay | Admitting: Family Medicine

## 2016-04-10 ENCOUNTER — Ambulatory Visit (INDEPENDENT_AMBULATORY_CARE_PROVIDER_SITE_OTHER): Payer: Medicare HMO | Admitting: Family Medicine

## 2016-04-10 VITALS — BP 140/78 | HR 99 | Resp 16 | Ht <= 58 in | Wt 148.0 lb

## 2016-04-10 DIAGNOSIS — R609 Edema, unspecified: Secondary | ICD-10-CM

## 2016-04-10 DIAGNOSIS — N183 Chronic kidney disease, stage 3 unspecified: Secondary | ICD-10-CM

## 2016-04-10 DIAGNOSIS — J449 Chronic obstructive pulmonary disease, unspecified: Secondary | ICD-10-CM

## 2016-04-10 DIAGNOSIS — E538 Deficiency of other specified B group vitamins: Secondary | ICD-10-CM | POA: Diagnosis not present

## 2016-04-10 DIAGNOSIS — E559 Vitamin D deficiency, unspecified: Secondary | ICD-10-CM

## 2016-04-10 DIAGNOSIS — I10 Essential (primary) hypertension: Secondary | ICD-10-CM

## 2016-04-10 MED ORDER — LOSARTAN POTASSIUM 25 MG PO TABS: 25.0000 mg | ORAL_TABLET | Freq: Every day | ORAL | Status: DC

## 2016-04-10 MED ORDER — HYDROCHLOROTHIAZIDE 12.5 MG PO TABS: 12.5000 mg | ORAL_TABLET | Freq: Every day | ORAL | 2 refills | Status: DC

## 2016-04-10 NOTE — Progress Notes (Signed)
Date:  04/10/2016   Name:  Katie Conway   DOB:  1940/11/22   MRN:  [id number]  PCP:  Adline Potter, MD    Chief Complaint: Foot Swelling (Started Sunday )   History of Present Illness:  This is a 76 y.o. female seem for 5d f/u, placed back on prednisone 10 mg bid x 5d for COPD exacerbation last visit, also started back on losartan for elevated BP and B12 dose decreased for high level. C/o 2d hx pedal edema, never had before with losartan or prednisone, nonpainful, L>R. Had been on HCTZ 25 mg daily but stopped in Jan due to orthostasis. Cough and breathing improved on prednisone, last dose today. Generally feels better. Weight up 6#.  Review of Systems:  Review of Systems  Constitutional: Negative for appetite change, chills and fever.  HENT: Negative for facial swelling.   Respiratory: Negative for shortness of breath.   Cardiovascular: Negative for chest pain and palpitations.  Gastrointestinal: Negative for abdominal distention and abdominal pain.  Genitourinary: Negative for difficulty urinating.  Neurological: Negative for syncope and light-headedness.    Patient Active Problem List   Diagnosis Date Noted  . B12 deficiency 04/10/2016  . Anemia 04/05/2016  . Obesity (BMI 30.0-34.9) 04/05/2016  . Chest pain at rest 04/24/2015  . SVT (supraventricular tachycardia) ([place]) 04/24/2015  . Elevated troponin 04/24/2015  . Acid reflux 09/22/2014  . Leukocytosis 09/22/2014  . Cataract 06/08/2014  . CAFL (chronic airflow limitation) ([place]) 03/22/2014  . Hypercholesterolemia without hypertriglyceridemia 11/27/2013  . Vitamin D deficiency 08/31/2013  . Chronic kidney disease (CKD), stage III (moderate) 05/16/2011  . Essential (primary) hypertension 05/16/2011    Prior to Admission medications   Medication Sig Start Date End Date Taking? Authorizing Provider  albuterol (PROVENTIL HFA;VENTOLIN HFA) 108 (90 Base) MCG/ACT inhaler Inhale into the lungs. 12/22/14  Yes Historical  Provider, MD  Calcium-Vitamin D (CALTRATE 600 PLUS-VIT D PO) Take 1 tablet by mouth every morning. Reported on 02/10/2015   Yes Historical Provider, MD  Fluticasone Furoate-Vilanterol (BREO ELLIPTA IN) Inhale into the lungs.   Yes Historical Provider, MD  ipratropium-albuterol (DUONEB) 0.5-2.5 (3) MG/3ML SOLN Take 3 mLs by nebulization 4 (four) times daily as needed (for wheezing/shortness of breath.).    Yes Historical Provider, MD  losartan (COZAAR) 25 MG tablet Take 1 tablet (25 mg total) by mouth daily. 04/10/16  Yes Adline Potter, MD  metoprolol succinate (TOPROL-XL) 25 MG 24 hr tablet Take 25 mg by mouth daily. Reported on 02/16/2015   Yes Historical Provider, MD  montelukast (SINGULAIR) 10 MG tablet Take 1 tablet by mouth daily. 08/16/15 08/15/16 Yes Historical Provider, MD  Omega-3 Fatty Acids (FISH OIL) 1000 MG CAPS Take 1,000 mg by mouth daily.   Yes Historical Provider, MD  ranitidine (ZANTAC) 150 MG tablet Take 1 tablet by mouth daily.   Yes Historical Provider, MD  vitamin B-12 (CYANOCOBALAMIN) 1000 MCG tablet Take 500 mcg by mouth daily.   Yes Historical Provider, MD  hydrochlorothiazide (HYDRODIURIL) 12.5 MG tablet Take 1 tablet (12.5 mg total) by mouth daily. 04/10/16   Adline Potter, MD    Allergies  Allergen Reactions  . Amoxicillin Other (See Comments)    Hair loss Hair loss  . Aspirin Other (See Comments)    GI Upset  . Lisinopril Other (See Comments)    Patient states this medication made her very weak.  . Amlodipine Rash and Other (See Comments)    Fatigue     Past Surgical History:  Procedure Laterality Date  . CATARACT EXTRACTION W/PHACO Left 06/02/2014   Procedure: CATARACT EXTRACTION PHACO AND INTRAOCULAR LENS PLACEMENT (IOC);  Surgeon: Leandrew Koyanagi, MD;  Location: [place];  Service: Ophthalmology;  Laterality: Left;  . CATARACT EXTRACTION W/PHACO Right 02/16/2015   Procedure: CATARACT EXTRACTION PHACO AND INTRAOCULAR LENS PLACEMENT (IOC);   Surgeon: Leandrew Koyanagi, MD;  Location: [place];  Service: Ophthalmology;  Laterality: Right;  . CESAREAN SECTION      Social History  Substance Use Topics  . Smoking status: Former Smoker    Types: Cigarettes    Quit date: 01/23/2011  . Smokeless tobacco: Never Used     Comment: Previous smoker; 1 pack a day for 50 years...  . Alcohol use No     Comment: glass wine 2-3 times per year    Family History  Problem Relation Age of Onset  . Heart failure Mother   . Heart attack Father   . COPD Brother   . Kidney failure Brother   . Breast cancer Other 30    Medication list has been reviewed and updated.  Physical Examination: BP 140/78   Pulse 99   Resp 16   Ht '4\' 8"'  (1.422 m)   Wt 148 lb (67.1 kg)   SpO2 (!) 84%   BMI 33.18 kg/m   Physical Exam  Constitutional: She appears well-developed and well-nourished.  Cardiovascular: Normal rate, regular rhythm, normal heart sounds and intact distal pulses.   Pulmonary/Chest: Effort normal and breath sounds normal. She has no wheezes. She has no rales.  Musculoskeletal:  1+ BLE edema L>R, nontender  Neurological: She is alert.  Skin: Skin is warm and dry.  Psychiatric: She has a normal mood and affect. Her behavior is normal.  Nursing note and vitals reviewed.   Assessment and Plan:  1. Peripheral edema Likely fluid retention given weight gain, may be due to prednisone or losartan though never had with these meds in past. Will d/c prednisone and add HCTZ 12.5 mg daily, call if edema fails to improve  2. Essential (primary) hypertension Improved on metoprolol/losartan, add HCTZ  3. Chronic kidney disease (CKD), stage III (moderate) EGFR 32 last visit improved from 27, repeat BMP next visit, followed by [facility] nephro  4. Chronic obstructive pulmonary disease, unspecified COPD type ([place]) Consider refer back Dr. Raul Del if COPD sxs recur off prednisone  5. Vitamin D deficiency Well controlled on  supplement  6. B12 deficiency On decreased supplement (1000 mcg qod), recheck next visit  Return in about 3 weeks (around 05/01/2016).  Katie Conway. [facility]  04/10/2016

## 2016-04-11 LAB — FERRITIN: FERRITIN: 280 ng/mL — AB (ref 15–150)

## 2016-04-21 ENCOUNTER — Encounter: Payer: Self-pay | Admitting: Emergency Medicine

## 2016-04-21 ENCOUNTER — Ambulatory Visit
Admission: EM | Admit: 2016-04-21 | Discharge: 2016-04-21 | Disposition: A | Discharge status: Home / Self Care | Payer: Medicare HMO | Attending: Family Medicine | Admitting: Family Medicine

## 2016-04-21 DIAGNOSIS — L03115 Cellulitis of right lower limb: Secondary | ICD-10-CM | POA: Diagnosis not present

## 2016-04-21 MED ORDER — HYDROCODONE-ACETAMINOPHEN 5-325 MG PO TABS: ORAL_TABLET | ORAL | 0 refills | Status: DC

## 2016-04-21 MED ORDER — CEPHALEXIN 250 MG PO CAPS: 250.0000 mg | ORAL_CAPSULE | Freq: Three times a day (TID) | ORAL | 0 refills | Status: DC

## 2016-04-21 NOTE — ED Provider Notes (Signed)
[facility]    CSN: [id number] Arrival date & time: 04/21/16  0859     History   Chief Complaint Chief Complaint  Patient presents with  . Foot Pain    right    HPI Katie Conway is a 76 y.o. female.   76 yo female with a c/o right big toe and foot redness and pain since last night. States she had a pedicure yesterday morning. Last night started experiencing symptoms and today redness appears to be spreading. Denies any fevers, chills or drainage.    The history is provided by the patient.  Foot Pain     Past Medical History:  Diagnosis Date  . Arthritis    Knees  . Chronic bronchitis ([place])   . COPD (chronic obstructive pulmonary disease) ([place])   . Diverticulosis   . GERD (gastroesophageal reflux disease)   . Hypertension   . Osteoporosis   . Renal insufficiency   . Shortness of breath dyspnea    with exertion  . Wears dentures    Full - upper.  Partial - Lower  . Wears dentures full upper and partial lower    Patient Active Problem List   Diagnosis Date Noted  . B12 deficiency 04/10/2016  . Anemia 04/05/2016  . Obesity (BMI 30.0-34.9) 04/05/2016  . Chest pain at rest 04/24/2015  . SVT (supraventricular tachycardia) ([place]) 04/24/2015  . Elevated troponin 04/24/2015  . Acid reflux 09/22/2014  . Leukocytosis 09/22/2014  . Cataract 06/08/2014  . CAFL (chronic airflow limitation) ([place]) 03/22/2014  . Hypercholesterolemia without hypertriglyceridemia 11/27/2013  . Vitamin D deficiency 08/31/2013  . Chronic kidney disease (CKD), stage III (moderate) 05/16/2011  . Essential (primary) hypertension 05/16/2011    Past Surgical History:  Procedure Laterality Date  . CATARACT EXTRACTION W/PHACO Left 06/02/2014   Procedure: CATARACT EXTRACTION PHACO AND INTRAOCULAR LENS PLACEMENT (IOC);  Surgeon: Leandrew Koyanagi, MD;  Location: [place];  Service: Ophthalmology;  Laterality: Left;  . CATARACT EXTRACTION W/PHACO Right 02/16/2015   Procedure: CATARACT EXTRACTION PHACO AND INTRAOCULAR LENS PLACEMENT (IOC);  Surgeon: Leandrew Koyanagi, MD;  Location: [place];  Service: Ophthalmology;  Laterality: Right;  . CESAREAN SECTION      OB History    No data available       Home Medications    Prior to Admission medications   Medication Sig Start Date End Date Taking? Authorizing Provider  albuterol (PROVENTIL HFA;VENTOLIN HFA) 108 (90 Base) MCG/ACT inhaler Inhale into the lungs. 12/22/14   Historical Provider, MD  Calcium-Vitamin D (CALTRATE 600 PLUS-VIT D PO) Take 1 tablet by mouth every morning. Reported on 02/10/2015    Historical Provider, MD  cephALEXin (KEFLEX) 250 MG capsule Take 1 capsule (250 mg total) by mouth 3 (three) times daily. 04/21/16   Norval Gable, MD  Fluticasone Furoate-Vilanterol (BREO ELLIPTA IN) Inhale into the lungs.    Historical Provider, MD  hydrochlorothiazide (HYDRODIURIL) 12.5 MG tablet Take 1 tablet (12.5 mg total) by mouth daily. 04/10/16   Adline Potter, MD  HYDROcodone-acetaminophen (NORCO/VICODIN) 5-325 MG tablet 1 tab po q 8 hours prn 04/21/16   Norval Gable, MD  ipratropium-albuterol (DUONEB) 0.5-2.5 (3) MG/3ML SOLN Take 3 mLs by nebulization 4 (four) times daily as needed (for wheezing/shortness of breath.).     Historical Provider, MD  losartan (COZAAR) 25 MG tablet Take 1 tablet (25 mg total) by mouth daily. 04/10/16   Adline Potter, MD  metoprolol succinate (TOPROL-XL) 25 MG 24 hr tablet Take 25 mg by  mouth daily. Reported on 02/16/2015    Historical Provider, MD  montelukast (SINGULAIR) 10 MG tablet Take 1 tablet by mouth daily. 08/16/15 08/15/16  Historical Provider, MD  Omega-3 Fatty Acids (FISH OIL) 1000 MG CAPS Take 1,000 mg by mouth daily.    Historical Provider, MD  ranitidine (ZANTAC) 150 MG tablet Take 1 tablet by mouth daily.    Historical Provider, MD  vitamin B-12 (CYANOCOBALAMIN) 1000 MCG tablet Take 500 mcg by mouth daily.    Historical Provider, MD    Family  History Family History  Problem Relation Age of Onset  . Heart failure Mother   . Heart attack Father   . COPD Brother   . Kidney failure Brother   . Breast cancer Other 30    Social History Social History  Substance Use Topics  . Smoking status: Former Smoker    Types: Cigarettes    Quit date: 01/23/2011  . Smokeless tobacco: Never Used     Comment: Previous smoker; 1 pack a day for 50 years...  . Alcohol use No     Comment: glass wine 2-3 times per year     Allergies   Amoxicillin; Aspirin; Lisinopril; and Amlodipine   Review of Systems Review of Systems   Physical Exam Triage Vital Signs ED Triage Vitals  Enc Vitals Group     BP 04/21/16 0944 (!) 142/76     Pulse Rate 04/21/16 0944 83     Resp 04/21/16 0944 17     Temp 04/21/16 0944 97.8 F (36.6 C)     Temp Source 04/21/16 0944 Oral     SpO2 04/21/16 0944 97 %     Weight 04/21/16 0944 143 lb (64.9 kg)     Height 04/21/16 0944 4\' 8"  (1.422 m)     Head Circumference --      Peak Flow --      Pain Score 04/21/16 0945 10     Pain Loc --      Pain Edu? --      Excl. in [place]? --    No data found.   Updated Vital Signs BP (!) 142/76 (BP Location: Left Arm)   Pulse 83   Temp 97.8 F (36.6 C) (Oral)   Resp 17   Ht 4\' 8"  (1.422 m)   Wt 143 lb (64.9 kg)   SpO2 97%   BMI 32.06 kg/m   Visual Acuity Right Eye Distance:   Left Eye Distance:   Bilateral Distance:    Right Eye Near:   Left Eye Near:    Bilateral Near:     Physical Exam  Constitutional: She appears well-developed and well-nourished. No distress.  Musculoskeletal:       Right foot: There is tenderness and swelling. There is normal range of motion, no bony tenderness, normal capillary refill, no crepitus, no deformity and no laceration.       Feet:  Skin over the 1st MTP joint with blanchable erythema, warmth and tenderness to palpation with streaks of erythema extending proximal on the foot and distal towards the big toe; no drainage    Skin: She is not diaphoretic.  Nursing note and vitals reviewed.    UC Treatments / Results  Labs (all labs ordered are listed, but only abnormal results are displayed) Labs Reviewed - No data to display  EKG  EKG Interpretation None       Radiology No results found.  Procedures Procedures (including critical care time)  Medications Ordered in UC  Medications - No data to display   Initial Impression / Assessment and Plan / UC Course  I have reviewed the triage vital signs and the nursing notes.  Pertinent labs & imaging results that were available during my care of the patient were reviewed by me and considered in my medical decision making (see chart for details).       Final Clinical Impressions(s) / UC Diagnoses   Final diagnoses:  Cellulitis of foot, right    New Prescriptions Discharge Medication List as of 04/21/2016 10:22 AM    START taking these medications   Details  cephALEXin (KEFLEX) 250 MG capsule Take 1 capsule (250 mg total) by mouth 3 (three) times daily., Starting Sat 04/21/2016, Normal    HYDROcodone-acetaminophen (NORCO/VICODIN) 5-325 MG tablet 1 tab po q 8 hours prn, Print       1. diagnosis reviewed with patient 2. rx as per orders above; reviewed possible side effects, interactions, risks and benefits  3. Recommend supportive treatment with rest, elevation, warm compresses to area  4. Follow-up prn if symptoms worsen or don't improve   Norval Gable, MD 04/21/16 1209

## 2016-04-21 NOTE — ED Triage Notes (Signed)
Patient c/o pain on the left side of her right foot that started last night.  Patient reports redness and swelling. Patient denies injury or fall.

## 2016-05-03 ENCOUNTER — Encounter: Payer: Self-pay | Admitting: Family Medicine

## 2016-05-03 ENCOUNTER — Ambulatory Visit (INDEPENDENT_AMBULATORY_CARE_PROVIDER_SITE_OTHER): Payer: Medicare HMO | Admitting: Family Medicine

## 2016-05-03 VITALS — BP 138/84 | HR 76 | Resp 16 | Ht <= 58 in | Wt 142.0 lb

## 2016-05-03 DIAGNOSIS — N183 Chronic kidney disease, stage 3 unspecified: Secondary | ICD-10-CM

## 2016-05-03 DIAGNOSIS — E559 Vitamin D deficiency, unspecified: Secondary | ICD-10-CM

## 2016-05-03 DIAGNOSIS — I10 Essential (primary) hypertension: Secondary | ICD-10-CM

## 2016-05-03 DIAGNOSIS — J449 Chronic obstructive pulmonary disease, unspecified: Secondary | ICD-10-CM

## 2016-05-03 DIAGNOSIS — D649 Anemia, unspecified: Secondary | ICD-10-CM

## 2016-05-03 DIAGNOSIS — E669 Obesity, unspecified: Secondary | ICD-10-CM | POA: Diagnosis not present

## 2016-05-03 DIAGNOSIS — E538 Deficiency of other specified B group vitamins: Secondary | ICD-10-CM

## 2016-05-03 MED ORDER — LOSARTAN POTASSIUM 25 MG PO TABS: 25.0000 mg | ORAL_TABLET | Freq: Every day | ORAL | 3 refills | Status: AC

## 2016-05-03 NOTE — Progress Notes (Signed)
Date:  05/03/2016   Name:  Katie Conway   DOB:  1940/12/24   MRN:  [id number]  PCP:  Adline Potter, MD    Chief Complaint: Cellulitis (ER 04/21/2016 Follow up )   History of Present Illness:  This is a 76 y.o. female seen for one month f/u. LE edema resolved on HCTZ. Put self back on prednisone for SOB, has f/u with Dr. Raul Del soon. Seen ER 2wks ago for cellulitis, sxs resolved on Keflex. Feeling well.  Review of Systems:  Review of Systems  Constitutional: Negative for chills and fever.  Cardiovascular: Negative for chest pain and leg swelling.  Gastrointestinal: Negative for abdominal pain.  Endocrine: Negative for polydipsia and polyuria.  Genitourinary: Negative for difficulty urinating.  Neurological: Negative for syncope and light-headedness.    Patient Active Problem List   Diagnosis Date Noted  . B12 deficiency 04/10/2016  . Anemia 04/05/2016  . Obesity (BMI 30.0-34.9) 04/05/2016  . Chest pain at rest 04/24/2015  . SVT (supraventricular tachycardia) ([place]) 04/24/2015  . Elevated troponin 04/24/2015  . Acid reflux 09/22/2014  . Leukocytosis 09/22/2014  . Cataract 06/08/2014  . CAFL (chronic airflow limitation) ([place]) 03/22/2014  . Hypercholesterolemia without hypertriglyceridemia 11/27/2013  . Vitamin D deficiency 08/31/2013  . Chronic kidney disease (CKD), stage III (moderate) 05/16/2011  . Essential (primary) hypertension 05/16/2011    Prior to Admission medications   Medication Sig Start Date End Date Taking? Authorizing Provider  albuterol (PROVENTIL HFA;VENTOLIN HFA) 108 (90 Base) MCG/ACT inhaler Inhale into the lungs. 12/22/14  Yes Historical Provider, MD  Calcium-Vitamin D (CALTRATE 600 PLUS-VIT D PO) Take 1 tablet by mouth every morning. Reported on 02/10/2015   Yes Historical Provider, MD  Fluticasone Furoate-Vilanterol (BREO ELLIPTA IN) Inhale into the lungs.   Yes Historical Provider, MD  hydrochlorothiazide (HYDRODIURIL) 12.5 MG tablet Take 1 tablet  (12.5 mg total) by mouth daily. 04/10/16  Yes Adline Potter, MD  ipratropium-albuterol (DUONEB) 0.5-2.5 (3) MG/3ML SOLN Take 3 mLs by nebulization 4 (four) times daily as needed (for wheezing/shortness of breath.).    Yes Historical Provider, MD  losartan (COZAAR) 25 MG tablet Take 1 tablet (25 mg total) by mouth daily. 05/03/16  Yes Adline Potter, MD  metoprolol succinate (TOPROL-XL) 25 MG 24 hr tablet TAKE ONE TABLET BY MOUTH ONCE DAILY 04/13/16  Yes Historical Provider, MD  montelukast (SINGULAIR) 10 MG tablet Take 1 tablet by mouth daily. 08/16/15 08/15/16 Yes Historical Provider, MD  Omega-3 Fatty Acids (FISH OIL) 1000 MG CAPS Take 1,000 mg by mouth daily.   Yes Historical Provider, MD  ranitidine (ZANTAC) 150 MG tablet Take 1 tablet by mouth daily.   Yes Historical Provider, MD  vitamin B-12 (CYANOCOBALAMIN) 1000 MCG tablet Take 500 mcg by mouth daily.   Yes Historical Provider, MD    Allergies  Allergen Reactions  . Amoxicillin Other (See Comments)    Hair loss Hair loss  . Aspirin Other (See Comments)    GI Upset  . Lisinopril Other (See Comments)    Patient states this medication made her very weak.  . Amlodipine Rash and Other (See Comments)    Fatigue     Past Surgical History:  Procedure Laterality Date  . CATARACT EXTRACTION W/PHACO Left 06/02/2014   Procedure: CATARACT EXTRACTION PHACO AND INTRAOCULAR LENS PLACEMENT (IOC);  Surgeon: Leandrew Koyanagi, MD;  Location: [place];  Service: Ophthalmology;  Laterality: Left;  . CATARACT EXTRACTION W/PHACO Right 02/16/2015   Procedure: CATARACT EXTRACTION PHACO AND INTRAOCULAR LENS PLACEMENT (  [place]);  Surgeon: Leandrew Koyanagi, MD;  Location: [place];  Service: Ophthalmology;  Laterality: Right;  . CESAREAN SECTION      Social History  Substance Use Topics  . Smoking status: Former Smoker    Types: Cigarettes    Quit date: 01/23/2011  . Smokeless tobacco: Never Used     Comment: Previous smoker; 1 pack a  day for 50 years...  . Alcohol use No     Comment: glass wine 2-3 times per year    Family History  Problem Relation Age of Onset  . Heart failure Mother   . Heart attack Father   . COPD Brother   . Kidney failure Brother   . Breast cancer Other 30    Medication list has been reviewed and updated.  Physical Examination: BP 138/84   Pulse 76   Resp 16   Ht 4\' 8"  (1.422 m)   Wt 142 lb (64.4 kg)   SpO2 99%   BMI 31.84 kg/m   Physical Exam  Constitutional: She appears well-developed and well-nourished.  Cardiovascular: Normal rate, regular rhythm and normal heart sounds.   Pulmonary/Chest: Effort normal and breath sounds normal.  Musculoskeletal: She exhibits no edema.  Neurological: She is alert.  Skin: Skin is warm and dry.  Psychiatric: She has a normal mood and affect. Her behavior is normal.  Nursing note and vitals reviewed.   Assessment and Plan:  1. Essential (primary) hypertension Well controlled on losartan/HCTZ/metoprolol  2. Chronic obstructive pulmonary disease, unspecified COPD type ([place]) Back on chronic prednisone, f/u with Dr. Raul Del scheduled  3. Chronic kidney disease (CKD), stage III (moderate) Improved last visit  4. Vitamin D deficiency Well controlled on supplement  5. B12 deficiency On decreased supplement  6. Anemia, unspecified type ACD vs. renal, stable  7. Obesity (BMI 30.0-34.9) Weight down 6# with HCTZ  8. Med review Consider d/c fish oil/Zantac next visit  Return in about 3 months (around 08/02/2016).  [name]. [facility]  05/03/2016

## 2016-06-13 ENCOUNTER — Telehealth: Payer: Self-pay

## 2016-06-13 NOTE — Telephone Encounter (Signed)
Patient cancelled appt due to ins [employer] said [facility] is not covered or in network. She said they referred her here. I advised her to call and verify and she is calling me back tomorrow.

## 2016-07-11 ENCOUNTER — Other Ambulatory Visit: Payer: Self-pay | Admitting: Family Medicine

## 2016-08-02 ENCOUNTER — Ambulatory Visit: Payer: Medicare HMO | Admitting: Family Medicine

## 2016-08-28 ENCOUNTER — Other Ambulatory Visit: Payer: Self-pay | Admitting: Specialist

## 2016-08-28 DIAGNOSIS — R911 Solitary pulmonary nodule: Secondary | ICD-10-CM

## 2016-09-10 ENCOUNTER — Ambulatory Visit: Payer: Medicare HMO

## 2016-09-13 ENCOUNTER — Other Ambulatory Visit: Payer: Self-pay | Admitting: Medical Oncology

## 2016-09-13 DIAGNOSIS — M810 Age-related osteoporosis without current pathological fracture: Secondary | ICD-10-CM

## 2016-09-17 ENCOUNTER — Ambulatory Visit: Payer: Medicare HMO

## 2016-09-28 ENCOUNTER — Telehealth: Payer: Self-pay | Admitting: Family Medicine

## 2016-09-28 NOTE — Telephone Encounter (Signed)
Spoke with pt to get clarification on cancellation note from [facility] cancelled visit from 8/27. Pt stated that her [facility] plan is not covered by visits to see Dr. Vicente Masson and that she is looking for her a new provider. knb

## 2017-01-21 ENCOUNTER — Encounter: Payer: Self-pay | Admitting: Emergency Medicine

## 2017-01-21 ENCOUNTER — Other Ambulatory Visit: Payer: Self-pay

## 2017-01-21 ENCOUNTER — Ambulatory Visit
Admission: EM | Admit: 2017-01-21 | Discharge: 2017-01-21 | Disposition: A | Discharge status: Home / Self Care | Payer: Medicare HMO | Attending: Emergency Medicine | Admitting: Emergency Medicine

## 2017-01-21 ENCOUNTER — Ambulatory Visit (INDEPENDENT_AMBULATORY_CARE_PROVIDER_SITE_OTHER): Payer: Medicare HMO

## 2017-01-21 DIAGNOSIS — J441 Chronic obstructive pulmonary disease with (acute) exacerbation: Secondary | ICD-10-CM

## 2017-01-21 DIAGNOSIS — R0602 Shortness of breath: Secondary | ICD-10-CM

## 2017-01-21 DIAGNOSIS — R062 Wheezing: Secondary | ICD-10-CM

## 2017-01-21 DIAGNOSIS — R05 Cough: Secondary | ICD-10-CM | POA: Diagnosis not present

## 2017-01-21 MED ORDER — DOXYCYCLINE HYCLATE 100 MG PO CAPS: 100.0000 mg | ORAL_CAPSULE | Freq: Two times a day (BID) | ORAL | 0 refills | Status: DC

## 2017-01-21 MED ORDER — IPRATROPIUM-ALBUTEROL 0.5-2.5 (3) MG/3ML IN SOLN
3.0000 mL | Freq: Once | RESPIRATORY_TRACT | Status: AC
  Administered 2017-01-21: 3 mL via RESPIRATORY_TRACT

## 2017-01-21 NOTE — Discharge Instructions (Signed)
Use albuterol and your albuterol nebulizer as necessary for shortness of breath.  Double up on your prednisone to 10 mg daily for the next 4 days and decrease back down to your normal 5.  If you are not improving follow-up with your primary physician.

## 2017-01-21 NOTE — ED Triage Notes (Signed)
Patient c/o cough and chest congestion since last Wed. Patient denies fevers.

## 2017-01-21 NOTE — ED Provider Notes (Addendum)
[facility]    CSN: [id number] Arrival date & time: 01/21/17  1034     History   Chief Complaint Chief Complaint  Patient presents with  . Cough    HPI Katie Conway is a 76 y.o. female.   HPI  76 year old female presents with cough and chest congestion since last Wednesday.  Denies any fever.  O2 sats are 96%. History of COPD and chronic bronchitis.  Is afebrile.   On Admission patient was having loud wheezing.  Became markedly improved.  She has been using her  albuterol inhaler as well as nebulizer.  Productive cough of white to yellow mucous.  He is scheduling appointment with another pulmonologist next week.    Past Medical History:  Diagnosis Date  . Arthritis    Knees  . Chronic bronchitis ([place])   . COPD (chronic obstructive pulmonary disease) ([place])   . Diverticulosis   . GERD (gastroesophageal reflux disease)   . Hypertension   . Osteoporosis   . Renal insufficiency   . Shortness of breath dyspnea    with exertion  . Wears dentures    Full - upper.  Partial - Lower  . Wears dentures full upper and partial lower    Patient Active Problem List   Diagnosis Date Noted  . B12 deficiency 04/10/2016  . Anemia 04/05/2016  . Obesity (BMI 30.0-34.9) 04/05/2016  . Chest pain at rest 04/24/2015  . SVT (supraventricular tachycardia) ([place]) 04/24/2015  . Elevated troponin 04/24/2015  . Acid reflux 09/22/2014  . Leukocytosis 09/22/2014  . Cataract 06/08/2014  . CAFL (chronic airflow limitation) ([place]) 03/22/2014  . Hypercholesterolemia without hypertriglyceridemia 11/27/2013  . Vitamin D deficiency 08/31/2013  . Chronic kidney disease (CKD), stage III (moderate) ([place]) 05/16/2011  . Essential (primary) hypertension 05/16/2011    Past Surgical History:  Procedure Laterality Date  . CATARACT EXTRACTION W/PHACO Left 06/02/2014   Procedure: CATARACT EXTRACTION PHACO AND INTRAOCULAR LENS PLACEMENT (IOC);  Surgeon: Leandrew Koyanagi, MD;  Location:  [place];  Service: Ophthalmology;  Laterality: Left;  . CATARACT EXTRACTION W/PHACO Right 02/16/2015   Procedure: CATARACT EXTRACTION PHACO AND INTRAOCULAR LENS PLACEMENT (IOC);  Surgeon: Leandrew Koyanagi, MD;  Location: [place];  Service: Ophthalmology;  Laterality: Right;  . CESAREAN SECTION      OB History    No data available       Home Medications    Prior to Admission medications   Medication Sig Start Date End Date Taking? Authorizing Provider  albuterol (PROVENTIL HFA;VENTOLIN HFA) 108 (90 Base) MCG/ACT inhaler Inhale into the lungs. 12/22/14  Yes [provider]  hydrochlorothiazide (HYDRODIURIL) 12.5 MG tablet TAKE 1 TABLET BY MOUTH ONCE DAILY 07/11/16  Yes [place], Gwyndolyn Saxon, MD  ipratropium-albuterol (DUONEB) 0.5-2.5 (3) MG/3ML SOLN Take 3 mLs by nebulization 4 (four) times daily as needed (for wheezing/shortness of breath.).    Yes [provider]  losartan (COZAAR) 25 MG tablet Take 1 tablet (25 mg total) by mouth daily. 05/03/16  Yes [place], Gwyndolyn Saxon, MD  metoprolol succinate (TOPROL-XL) 25 MG 24 hr tablet TAKE ONE TABLET BY MOUTH ONCE DAILY 04/13/16  Yes [provider]  montelukast (SINGULAIR) 10 MG tablet Take 1 tablet by mouth daily. 08/16/15 01/21/17 Yes [provider]  Omega-3 Fatty Acids (FISH OIL) 1000 MG CAPS Take 1,000 mg by mouth daily.   Yes [provider]  predniSONE (DELTASONE) 5 MG tablet Take 5 mg by mouth daily with breakfast.   Yes [provider]  ranitidine (ZANTAC) 150 MG tablet Take 1 tablet by mouth daily.   Yes [provider]  doxycycline (VIBRAMYCIN) 100 MG capsule Take 1 capsule (100 mg total) by mouth 2 (two) times daily. 01/21/17   Lorin Picket, PA-C  Fluticasone Furoate-Vilanterol (BREO ELLIPTA IN) Inhale into the lungs.    [provider]    Family History Family History  Problem Relation Age of Onset  . Heart failure Mother   . Heart attack  Father   . COPD Brother   . Kidney failure Brother   . Breast cancer Other 30    Social History Social History   Tobacco Use  . Smoking status: Former Smoker    Types: Cigarettes    Last attempt to quit: 01/23/2011    Years since quitting: 6.0  . Smokeless tobacco: Never Used  . Tobacco comment: Previous smoker; 1 pack a day for 50 years...  Substance Use Topics  . Alcohol use: No    Comment: glass wine 2-3 times per year  . Drug use: No     Allergies   Amoxicillin; Aspirin; Lisinopril; and Amlodipine   Review of Systems Review of Systems  Constitutional: Positive for activity change and fatigue. Negative for chills and fever.  HENT: Positive for congestion.   Respiratory: Positive for cough, shortness of breath and wheezing.   All other systems reviewed and are negative.    Physical Exam Triage Vital Signs ED Triage Vitals  Enc Vitals Group     BP 01/21/17 1113 136/77     Pulse Rate 01/21/17 1113 85     Resp 01/21/17 1113 16     Temp 01/21/17 1113 98 F (36.7 C)     Temp Source 01/21/17 1113 Oral     SpO2 01/21/17 1113 96 %     Weight 01/21/17 1110 150 lb (68 kg)     Height 01/21/17 1110 4\' 8"  (1.422 m)     Head Circumference --      Peak Flow --      Pain Score 01/21/17 1110 0     Pain Loc --      Pain Edu? --      Excl. in [place]? --    No data found.  Updated Vital Signs BP 136/77 (BP Location: Left Arm)   Pulse 85   Temp 98 F (36.7 C) (Oral)   Resp 16   Ht 4\' 8"  (1.422 m)   Wt 150 lb (68 kg)   SpO2 96%   BMI 33.63 kg/m   Visual Acuity Right Eye Distance:   Left Eye Distance:   Bilateral Distance:    Right Eye Near:   Left Eye Near:    Bilateral Near:     Physical Exam  Constitutional: She is oriented to person, place, and time. She appears well-developed and well-nourished. No distress.  HENT:  Head: Normocephalic.  Right Ear: External ear normal.  Left Ear: External ear normal.  Nose: Nose normal.  Mouth/Throat: Oropharynx is  clear and moist. No oropharyngeal exudate.  Eyes: EOM are normal. Pupils are equal, round, and reactive to light. Right eye exhibits no discharge. Left eye exhibits no discharge.  Neck: Normal range of motion. Neck supple.  Pulmonary/Chest: No stridor. She is in respiratory distress. She has wheezes.  Has loud audible wheezing with expiration.  Musculoskeletal: Normal range of motion.  Lymphadenopathy:    She has no cervical adenopathy.  Neurological: She is alert and oriented to person, place, and time.  Skin: Skin is warm and dry. She is not diaphoretic.  Psychiatric: She has a normal mood and affect. Her behavior is normal. Judgment and thought content normal.  Nursing note and vitals reviewed.    UC Treatments / Results  Labs (all labs ordered are listed, but only abnormal results are displayed) Labs Reviewed - No data to display  EKG  EKG Interpretation None       Radiology Dg Chest 2 View  Result Date: 01/21/2017 CLINICAL DATA:  Productive cough for several days EXAM: CHEST  2 VIEW COMPARISON:  04/24/2015 FINDINGS: The heart size and mediastinal contours are within normal limits. Both lungs are hyperinflated. The visualized skeletal structures are unremarkable. IMPRESSION: No active cardiopulmonary disease. Electronically Signed   By: Inez Catalina M.D.   On: 01/21/2017 14:02    Procedures Procedures (including critical care time)  Medications Ordered in UC Medications  ipratropium-albuterol (DUONEB) 0.5-2.5 (3) MG/3ML nebulizer solution 3 mL (3 mLs Nebulization Given 01/21/17 1451)  Patient had good results from the nebulizer treatment.  He had an exacerbation of her symptoms while ambulating to the bathroom.  At that time she was given nebulizer treatment above and improved significantly.  If she has further problems she should go to the emergency room more aggressive therapy if necessary.   Initial Impression / Assessment and Plan / UC Course  I have reviewed the  triage vital signs and the nursing notes.  Pertinent labs & imaging results that were available during my care of the patient were reviewed by me and considered in my medical decision making (see chart for details).    Plan: 1. Test/x-ray results and diagnosis reviewed with patient 2. rx as per orders; risks, benefits, potential side effects reviewed with patient 3. Recommend supportive treatment with continued use of the nebulizer at home and albuterol inhaler as necessary.  Of her COPD history I will prescribe doxycycline.  She will follow-up with her pulmonologist. 4. F/u prn if symptoms worsen or don't improve   Final Clinical Impressions(s) / UC Diagnoses   Final diagnoses:  COPD exacerbation [facility])    ED Discharge Orders        Ordered    doxycycline (VIBRAMYCIN) 100 MG capsule  2 times daily     01/21/17 1443       Controlled Substance Prescriptions [place] Controlled Substance Registry consulted? Not Applicable   Lorin Picket, PA-C 01/21/17 1832    Lorin Picket, PA-C 01/21/17 [phone]

## 2017-03-04 ENCOUNTER — Other Ambulatory Visit: Payer: Self-pay | Admitting: Medical Oncology

## 2017-03-04 ENCOUNTER — Ambulatory Visit
Admission: RE | Admit: 2017-03-04 | Discharge: 2017-03-04 | Disposition: A | Discharge status: Home / Self Care | Payer: Medicare HMO | Source: Ambulatory Visit | Attending: Medical Oncology | Admitting: Medical Oncology

## 2017-03-04 DIAGNOSIS — J441 Chronic obstructive pulmonary disease with (acute) exacerbation: Secondary | ICD-10-CM

## 2017-03-04 DIAGNOSIS — R918 Other nonspecific abnormal finding of lung field: Secondary | ICD-10-CM | POA: Diagnosis not present

## 2017-03-04 DIAGNOSIS — I700 Atherosclerosis of aorta: Secondary | ICD-10-CM | POA: Diagnosis not present

## 2017-03-13 ENCOUNTER — Other Ambulatory Visit: Payer: Self-pay | Admitting: Family Medicine

## 2017-03-13 DIAGNOSIS — Z1231 Encounter for screening mammogram for malignant neoplasm of breast: Secondary | ICD-10-CM

## 2017-03-20 ENCOUNTER — Ambulatory Visit
Admission: RE | Admit: 2017-03-20 | Discharge: 2017-03-20 | Disposition: A | Discharge status: Home / Self Care | Payer: Medicare HMO | Source: Ambulatory Visit | Attending: Medical Oncology | Admitting: Medical Oncology

## 2017-03-20 ENCOUNTER — Ambulatory Visit
Admission: RE | Admit: 2017-03-20 | Discharge: 2017-03-20 | Disposition: A | Discharge status: Home / Self Care | Payer: Medicare HMO | Source: Ambulatory Visit | Attending: Family Medicine | Admitting: Family Medicine

## 2017-03-20 DIAGNOSIS — Z1231 Encounter for screening mammogram for malignant neoplasm of breast: Secondary | ICD-10-CM | POA: Insufficient documentation

## 2017-03-20 DIAGNOSIS — M810 Age-related osteoporosis without current pathological fracture: Secondary | ICD-10-CM

## 2017-03-20 DIAGNOSIS — M818 Other osteoporosis without current pathological fracture: Secondary | ICD-10-CM | POA: Insufficient documentation

## 2017-04-04 ENCOUNTER — Ambulatory Visit (INDEPENDENT_AMBULATORY_CARE_PROVIDER_SITE_OTHER): Payer: Medicare HMO | Admitting: Vascular Surgery

## 2017-04-04 ENCOUNTER — Encounter (INDEPENDENT_AMBULATORY_CARE_PROVIDER_SITE_OTHER): Payer: Self-pay | Admitting: Vascular Surgery

## 2017-04-04 VITALS — BP 118/62 | HR 84 | Resp 16 | Ht 59.0 in | Wt 145.0 lb

## 2017-04-04 DIAGNOSIS — I10 Essential (primary) hypertension: Secondary | ICD-10-CM

## 2017-04-04 DIAGNOSIS — I739 Peripheral vascular disease, unspecified: Secondary | ICD-10-CM | POA: Diagnosis not present

## 2017-04-04 DIAGNOSIS — E7800 Pure hypercholesterolemia, unspecified: Secondary | ICD-10-CM | POA: Diagnosis not present

## 2017-04-04 DIAGNOSIS — K219 Gastro-esophageal reflux disease without esophagitis: Secondary | ICD-10-CM | POA: Diagnosis not present

## 2017-04-05 ENCOUNTER — Encounter (INDEPENDENT_AMBULATORY_CARE_PROVIDER_SITE_OTHER): Payer: Self-pay | Admitting: Vascular Surgery

## 2017-04-05 DIAGNOSIS — I739 Peripheral vascular disease, unspecified: Secondary | ICD-10-CM | POA: Insufficient documentation

## 2017-04-05 NOTE — Progress Notes (Signed)
MRN : [id number]  Katie Conway is a 77 y.o. ([birth year]) female who presents with chief complaint of  Chief Complaint  Patient presents with  . New Patient (Initial Visit)    Aortic atherosclerosis  .  History of Present Illness:  The patient is seen for evaluation of diminished pedal pulses. Patient notes she has tremendous difficulty walking because of her lungs and so does not have much pain.  The patient states the inability to walk secondary to her lung disease is now having a profound negative impact on quality of life and daily activities.  The patient denies rest pain or dangling of an extremity off the side of the bed during the night for relief. No open wounds or sores at this time. No prior interventions or surgeries.  No history of back problems or DJD of the lumbar sacral spine.   The patient denies changes in claudication symptoms or new rest pain symptoms.  No new ulcers or wounds of the foot.  The patient's blood pressure has been stable and relatively well controlled. The patient denies amaurosis fugax or recent TIA symptoms. There are no recent neurological changes noted. The patient denies history of DVT, PE or superficial thrombophlebitis.  The patient denies recent episodes of angina or abrupt worsening of her shortness of breath.   Current Meds  Medication Sig  . albuterol (PROVENTIL HFA;VENTOLIN HFA) 108 (90 Base) MCG/ACT inhaler Inhale into the lungs.  . Fluticasone Furoate-Vilanterol (BREO ELLIPTA IN) Inhale into the lungs.  . hydrochlorothiazide (HYDRODIURIL) 12.5 MG tablet TAKE 1 TABLET BY MOUTH ONCE DAILY  . ipratropium-albuterol (DUONEB) 0.5-2.5 (3) MG/3ML SOLN Take 3 mLs by nebulization 4 (four) times daily as needed (for wheezing/shortness of breath.).   Marland Kitchen losartan (COZAAR) 25 MG tablet Take 1 tablet (25 mg total) by mouth daily.  . metoprolol succinate (TOPROL-XL) 25 MG 24 hr tablet TAKE ONE TABLET BY MOUTH ONCE DAILY  . montelukast  (SINGULAIR) 10 MG tablet Take 10 mg by mouth at bedtime.  . Omega-3 Fatty Acids (FISH OIL) 1000 MG CAPS Take 1,000 mg by mouth daily.  . predniSONE (DELTASONE) 5 MG tablet Take 5 mg by mouth daily with breakfast.  . ranitidine (ZANTAC) 150 MG tablet Take 1 tablet by mouth daily.    Past Medical History:  Diagnosis Date  . Arthritis    Knees  . Chronic bronchitis ([place])   . COPD (chronic obstructive pulmonary disease) ([place])   . Diverticulosis   . GERD (gastroesophageal reflux disease)   . Hypertension   . Osteoporosis   . Renal insufficiency   . Shortness of breath dyspnea    with exertion  . Wears dentures    Full - upper.  Partial - Lower  . Wears dentures full upper and partial lower    Past Surgical History:  Procedure Laterality Date  . CATARACT EXTRACTION W/PHACO Left 06/02/2014   Procedure: CATARACT EXTRACTION PHACO AND INTRAOCULAR LENS PLACEMENT (IOC);  Surgeon: Leandrew Koyanagi, MD;  Location: [place];  Service: Ophthalmology;  Laterality: Left;  . CATARACT EXTRACTION W/PHACO Right 02/16/2015   Procedure: CATARACT EXTRACTION PHACO AND INTRAOCULAR LENS PLACEMENT (IOC);  Surgeon: Leandrew Koyanagi, MD;  Location: [place];  Service: Ophthalmology;  Laterality: Right;  . CESAREAN SECTION      Social History Social History   Tobacco Use  . Smoking status: Former Smoker    Types: Cigarettes    Last attempt to quit: 01/23/2011    Years since quitting: 6.2  .  Smokeless tobacco: Never Used  . Tobacco comment: Previous smoker; 1 pack a day for 50 years...  Substance Use Topics  . Alcohol use: No    Comment: glass wine 2-3 times per year  . Drug use: No    Family History Family History  Problem Relation Age of Onset  . Heart failure Mother   . Heart attack Father   . COPD Brother   . Kidney failure Brother   . Breast cancer Other 30  No family history of bleeding/clotting disorders, porphyria or autoimmune disease   Allergies  Allergen  Reactions  . Amoxicillin Other (See Comments)    Hair loss Hair loss  . Aspirin Other (See Comments)    GI Upset  . Lisinopril Other (See Comments)    Patient states this medication made her very weak.  . Amlodipine Rash and Other (See Comments)    Fatigue      REVIEW OF SYSTEMS (Negative unless checked)  Constitutional: [] Weight loss  [] Fever  [] Chills Cardiac: [] Chest pain   [] Chest pressure   [] Palpitations   [x] Shortness of breath when laying flat   [x] Shortness of breath with exertion. Vascular:  [] Pain in legs with walking   [] Pain in legs at rest  [] History of DVT   [] Phlebitis   [x] Swelling in legs   [] Varicose veins   [] Non-healing ulcers Pulmonary:   [] Uses home oxygen   [] Productive cough   [] Hemoptysis   [] Wheeze  [] COPD   [] Asthma Neurologic:  [] Dizziness   [] Seizures   [] History of stroke   [] History of TIA  [] Aphasia   [] Vissual changes   [] Weakness or numbness in arm   [] Weakness or numbness in leg Musculoskeletal:   [] Joint swelling   [] Joint pain   [] Low back pain Hematologic:  [] Easy bruising  [] Easy bleeding   [] Hypercoagulable state   [] Anemic Gastrointestinal:  [] Diarrhea   [] Vomiting  [] Gastroesophageal reflux/heartburn   [] Difficulty swallowing. Genitourinary:  [] Chronic kidney disease   [] Difficult urination  [] Frequent urination   [] Blood in urine Skin:  [] Rashes   [] Ulcers  Psychological:  [] History of anxiety   []  History of major depression.  Physical Examination  Vitals:   04/04/17 0946  BP: 118/62  Pulse: 84  Resp: 16  Weight: 145 lb (65.8 kg)  Height: 4\' 11"  (1.499 m)   Body mass index is 29.29 kg/m. Gen: WD/WN, NAD seen with O2 by nasal cannula  Head: [place]/AT, No temporalis wasting.  Ear/Nose/Throat: Hearing grossly intact, nares w/o erythema or drainage, poor dentition Eyes: PER, EOMI, sclera nonicteric.  Neck: Supple, no masses.  No bruit or JVD.  Pulmonary:  Good air movement, clear to auscultation bilaterally, no use of accessory  muscles.  Cardiac: RRR, normal S1, S2, no Murmurs. Vascular: no carotid bruits, scattered small varicosities present bilaterally.  Mild venous stasis changes to the legs bilaterally.  2+ soft pitting edema Vessel Right Left  Radial Palpable Palpable  Carotid Palpable Palpable  PT Not Palpable Not Palpable  DP Not Palpable Not Palpable  Gastrointestinal: soft, non-distended. No guarding/no peritoneal signs.  Musculoskeletal: M/S 5/5 throughout.  No deformity or atrophy.  Neurologic: CN 2-12 intact. Pain and light touch intact in extremities.  Symmetrical.  Speech is fluent. Motor exam as listed above. Psychiatric: Judgment intact, Mood & affect appropriate for pt's clinical situation. Dermatologic: No rashes or ulcers noted.  No changes consistent with cellulitis. Lymph : No Cervical lymphadenopathy, no lichenification or skin changes of chronic lymphedema.  CBC Lab Results  Component Value  Date   WBC 11.4 (H) 04/05/2016   HGB 10.5 (L) 04/05/2016   HCT 33.5 (L) 04/05/2016   MCV 82 04/05/2016   PLT 266 04/05/2016    BMET    Component Value Date/Time   NA 140 04/05/2016 1441   K 4.8 04/05/2016 1441   CL 102 04/05/2016 1441   CO2 22 04/05/2016 1441   GLUCOSE 91 04/05/2016 1441   GLUCOSE 93 04/25/2015 0400   BUN 23 04/05/2016 1441   CREATININE 1.80 (H) 04/05/2016 1441   CALCIUM 9.4 04/05/2016 1441   GFRNONAA 27 (L) 04/05/2016 1441   GFRAA 32 (L) 04/05/2016 1441   CrCl cannot be calculated (Patient's most recent lab result is older than the maximum 21 days allowed.).  COAG No results found for: INR, PROTIME  Radiology Dg Bone Density (dxa)  Result Date: 03/20/2017 EXAM: DUAL X-RAY ABSORPTIOMETRY (DXA) FOR BONE MINERAL DENSITY IMPRESSION: Dear Dr Colbert Ewing, Your patient Jlee Harkless completed a BMD test on 03/20/2017 using the [place] (analysis version: 14.10) manufactured by [employer]. The following summarizes the results of our evaluation.  PATIENT BIOGRAPHICAL: Name: Cate, Oravec Patient ID:  [id number] Birth Date: [birth year] Height:     59.0 in. Gender:      Female Exam Date:  03/20/2017 Weight:     147.0 lbs. Indications: Advanced Age, Chronic Glucocorticoids, copd, Early Menopause, Osteoarthritis, POSTmenopausal Fractures: Treatments: albuterol, prednisone, singulair ASSESSMENT: The BMD measured at AP Spine L1-L4 is 0.838 g/cm2 with a T-score of -2.8. This patient is considered OSTEOPOROTIC according to [facility]) criteria. Site Region Measured Measured WHO Young Adult BMD Date       Age      Classification T-score AP Spine L1-L4 03/20/2017 75.6 Osteoporosis -2.8 0.838 g/cm2 DualFemur Neck Left 03/20/2017 75.6 Osteopenia -1.7 0.796 g/cm2 World Health Organization [facility]) criteria for post-menopausal, Caucasian Women: Normal:       T-score at or above -1 SD Osteopenia:   T-score between -1 and -2.5 SD Osteoporosis: T-score at or below -2.5 SD RECOMMENDATIONS: 1. All patients should optimize calcium and vitamin D intake. 2. Consider FDA-approved medical therapies in postmenopausal women and men aged 27 years and older, based on the following: a. A hip or vertebral (clinical or morphometric) fracture b. T-score < -2.5 at the femoral neck or spine after appropriate evaluation to exclude secondary causes c. Low bone mass (T-score between -1.0 and -2.5 at the femoral neck or spine) and a 10-year probability of a hip fracture > 3% or a 10-year probability of a major osteoporosis-related fracture > 20% based on the [place]-adapted WHO algorithm d. Clinician judgment and/or patient preferences may indicate treatment for people with 10-year fracture probabilities above or below these levels 3. Patients with diagnosis of osteoporosis or at high risk for fracture should have regular bone mineral density tests. For patients eligible for Medicare, routine testing is allowed once every 2 years. The testing frequency can be increased to one year for  patients who have rapidly progressing disease, those who are receiving or discontinuing medical therapy to restore bone mass, or have additional risk factors. FOLLOW-UP: People with diagnosed cases of osteoporosis or at high risk for fracture should have regular bone mineral density tests. For patients eligible for Medicare, routine testing is allowed once every 2 years. The testing frequency can be increased to one year for patients who have rapidly progressing disease, those who are receiving or discontinuing medical therapy to restore bone mass, or have additional  risk factors. I have reviewed this report, and agree with the above findings. Mark A. Thornton Papas, M.D. [facility] Radiology Electronically Signed   By: Lavonia Dana M.D.   On: 03/20/2017 11:30   Mm Digital Screening Bilateral  Result Date: 03/21/2017 CLINICAL DATA:  Screening. EXAM: DIGITAL SCREENING BILATERAL MAMMOGRAM WITH CAD COMPARISON:  Previous exam(s). ACR Breast Density Category b: There are scattered areas of fibroglandular density. FINDINGS: There are no findings suspicious for malignancy. Images were processed with CAD. IMPRESSION: No mammographic evidence of malignancy. A result letter of this screening mammogram will be mailed directly to the patient. RECOMMENDATION: Screening mammogram in one year. (Code:SM-B-01Y) BI-RADS CATEGORY  1: Negative. Electronically Signed   By: Claudie Revering M.D.   On: 03/21/2017 09:38     Assessment/Plan 1. PAD (peripheral artery disease) (HCC)  Recommend:  The patient has evidence of atherosclerosis of the lower extremities with claudication.  The patient does not voice lifestyle limiting changes at this point in time.  Noninvasive studies do not suggest clinically significant change.  No invasive studies, angiography or surgery at this time The patient should continue walking and begin a more formal exercise program.  The patient should continue antiplatelet therapy and aggressive treatment of the  lipid abnormalities  No changes in the patient's medications at this time  The patient should continue wearing graduated compression socks 10-15 mmHg strength to control the mild edema.    2. Essential (primary) hypertension Continue antihypertensive medications as already ordered, these medications have been reviewed and there are no changes at this time.   3. Hypercholesterolemia without hypertriglyceridemia Continue statin as ordered and reviewed, no changes at this time   4. Gastroesophageal reflux disease, esophagitis presence not specified Continue antihypertensive medications as already ordered, these medications have been reviewed and there are no changes at this time.  Avoidence of caffeine and alcohol  Moderate elevation of the head of the bed    Hortencia Pilar, MD  04/05/2017 9:07 AM

## 2017-04-22 ENCOUNTER — Ambulatory Visit (INDEPENDENT_AMBULATORY_CARE_PROVIDER_SITE_OTHER): Payer: Medicare HMO

## 2017-04-22 ENCOUNTER — Encounter (INDEPENDENT_AMBULATORY_CARE_PROVIDER_SITE_OTHER): Payer: Self-pay | Admitting: Vascular Surgery

## 2017-04-22 ENCOUNTER — Ambulatory Visit (INDEPENDENT_AMBULATORY_CARE_PROVIDER_SITE_OTHER): Payer: Medicare HMO | Admitting: Vascular Surgery

## 2017-04-22 VITALS — BP 123/72 | HR 84 | Resp 16 | Ht 59.0 in | Wt 145.6 lb

## 2017-04-22 DIAGNOSIS — I471 Supraventricular tachycardia: Secondary | ICD-10-CM

## 2017-04-22 DIAGNOSIS — M79605 Pain in left leg: Secondary | ICD-10-CM | POA: Diagnosis not present

## 2017-04-22 DIAGNOSIS — K219 Gastro-esophageal reflux disease without esophagitis: Secondary | ICD-10-CM | POA: Diagnosis not present

## 2017-04-22 DIAGNOSIS — I739 Peripheral vascular disease, unspecified: Secondary | ICD-10-CM | POA: Diagnosis not present

## 2017-04-22 DIAGNOSIS — M79604 Pain in right leg: Secondary | ICD-10-CM | POA: Diagnosis not present

## 2017-04-22 DIAGNOSIS — I10 Essential (primary) hypertension: Secondary | ICD-10-CM

## 2017-04-22 DIAGNOSIS — M1991 Primary osteoarthritis, unspecified site: Secondary | ICD-10-CM

## 2017-04-25 DIAGNOSIS — M1990 Unspecified osteoarthritis, unspecified site: Secondary | ICD-10-CM | POA: Insufficient documentation

## 2017-04-25 DIAGNOSIS — M79606 Pain in leg, unspecified: Secondary | ICD-10-CM | POA: Insufficient documentation

## 2017-04-25 NOTE — Progress Notes (Signed)
MRN : [id number]  Katie Conway is a 77 y.o. (02-04-1940) female who presents with chief complaint of  Chief Complaint  Patient presents with  . Follow-up    pt conv abi  .  History of Present Illness:   The patient returns to the office for followup and review of the noninvasive studies. There have been no interval changes in lower extremity symptoms. No interval shortening of the patient's claudication distance or development of rest pain symptoms. No new ulcers or wounds have occurred since the last visit.  There have been no significant changes to the patient's overall health care.  The patient denies amaurosis fugax or recent TIA symptoms. There are no recent neurological changes noted. The patient denies history of DVT, PE or superficial thrombophlebitis. The patient denies recent episodes of angina or shortness of breath.     Current Meds  Medication Sig  . albuterol (PROVENTIL HFA;VENTOLIN HFA) 108 (90 Base) MCG/ACT inhaler Inhale into the lungs.  . Fluticasone Furoate-Vilanterol (BREO ELLIPTA IN) Inhale into the lungs.  . hydrochlorothiazide (HYDRODIURIL) 12.5 MG tablet TAKE 1 TABLET BY MOUTH ONCE DAILY  . ipratropium-albuterol (DUONEB) 0.5-2.5 (3) MG/3ML SOLN Take 3 mLs by nebulization 4 (four) times daily as needed (for wheezing/shortness of breath.).   Marland Kitchen losartan (COZAAR) 25 MG tablet Take 1 tablet (25 mg total) by mouth daily.  . metoprolol succinate (TOPROL-XL) 25 MG 24 hr tablet TAKE ONE TABLET BY MOUTH ONCE DAILY  . montelukast (SINGULAIR) 10 MG tablet Take 10 mg by mouth at bedtime.  . Omega-3 Fatty Acids (FISH OIL) 1000 MG CAPS Take 1,000 mg by mouth daily.  . predniSONE (DELTASONE) 5 MG tablet Take 5 mg by mouth daily with breakfast.  . ranitidine (ZANTAC) 150 MG tablet Take 1 tablet by mouth daily.    Past Medical History:  Diagnosis Date  . Arthritis    Knees  . Chronic bronchitis ([place])   . COPD (chronic obstructive pulmonary disease) ([place])   .  Diverticulosis   . GERD (gastroesophageal reflux disease)   . Hypertension   . Osteoporosis   . Renal insufficiency   . Shortness of breath dyspnea    with exertion  . Wears dentures    Full - upper.  Partial - Lower  . Wears dentures full upper and partial lower    Past Surgical History:  Procedure Laterality Date  . CATARACT EXTRACTION W/PHACO Left 06/02/2014   Procedure: CATARACT EXTRACTION PHACO AND INTRAOCULAR LENS PLACEMENT (IOC);  Surgeon: Leandrew Koyanagi, MD;  Location: [place];  Service: Ophthalmology;  Laterality: Left;  . CATARACT EXTRACTION W/PHACO Right 02/16/2015   Procedure: CATARACT EXTRACTION PHACO AND INTRAOCULAR LENS PLACEMENT (IOC);  Surgeon: Leandrew Koyanagi, MD;  Location: [place];  Service: Ophthalmology;  Laterality: Right;  . CESAREAN SECTION      Social History Social History   Tobacco Use  . Smoking status: Former Smoker    Types: Cigarettes    Last attempt to quit: 01/23/2011    Years since quitting: 6.2  . Smokeless tobacco: Never Used  . Tobacco comment: Previous smoker; 1 pack a day for 50 years...  Substance Use Topics  . Alcohol use: No    Comment: glass wine 2-3 times per year  . Drug use: No    Family History Family History  Problem Relation Age of Onset  . Heart failure Mother   . Heart attack Father   . COPD Brother   . Kidney failure Brother   .  Breast cancer Other 30    Allergies  Allergen Reactions  . Amoxicillin Other (See Comments)    Hair loss Hair loss  . Aspirin Other (See Comments)    GI Upset  . Lisinopril Other (See Comments)    Patient states this medication made her very weak.  . Amlodipine Rash and Other (See Comments)    Fatigue      REVIEW OF SYSTEMS (Negative unless checked)  Constitutional: [] Weight loss  [] Fever  [] Chills Cardiac: [] Chest pain   [] Chest pressure   [] Palpitations   [] Shortness of breath when laying flat   [] Shortness of breath with exertion. Vascular:   [x] Pain in legs with walking   [] Pain in legs at rest  [] History of DVT   [] Phlebitis   [] Swelling in legs   [] Varicose veins   [] Non-healing ulcers Pulmonary:   [] Uses home oxygen   [] Productive cough   [] Hemoptysis   [] Wheeze  [] COPD   [] Asthma Neurologic:  [] Dizziness   [] Seizures   [] History of stroke   [] History of TIA  [] Aphasia   [] Vissual changes   [] Weakness or numbness in arm   [] Weakness or numbness in leg Musculoskeletal:   [] Joint swelling   [x] Joint pain   [] Low back pain Hematologic:  [] Easy bruising  [] Easy bleeding   [] Hypercoagulable state   [] Anemic Gastrointestinal:  [] Diarrhea   [] Vomiting  [] Gastroesophageal reflux/heartburn   [] Difficulty swallowing. Genitourinary:  [] Chronic kidney disease   [] Difficult urination  [] Frequent urination   [] Blood in urine Skin:  [] Rashes   [] Ulcers  Psychological:  [] History of anxiety   []  History of major depression.  Physical Examination  Vitals:   04/22/17 1538  BP: 123/72  Pulse: 84  Resp: 16  Weight: 145 lb 9.6 oz (66 kg)  Height: 4\' 11"  (1.499 m)   Body mass index is 29.41 kg/m. Gen: WD/WN, NAD Head: [place]/AT, No temporalis wasting.  Ear/Nose/Throat: Hearing grossly intact, nares w/o erythema or drainage Eyes: PER, EOMI, sclera nonicteric.  Neck: Supple, no large masses.   Pulmonary:  Good air movement, no audible wheezing bilaterally, no use of accessory muscles.  Cardiac: RRR, no JVD Vascular:  Vessel Right Left  Radial Palpable Palpable  PT Trace Palpable Trace Palpable  DP Trace Palpable Trace Palpable  Gastrointestinal: Non-distended. No guarding/no peritoneal signs.  Musculoskeletal: M/S 5/5 throughout.  No deformity or atrophy.  Neurologic: CN 2-12 intact. Symmetrical.  Speech is fluent. Motor exam as listed above. Psychiatric: Judgment intact, Mood & affect appropriate for pt's clinical situation. Dermatologic: No rashes or ulcers noted.  No changes consistent with cellulitis. Lymph : No lichenification or skin  changes of chronic lymphedema.  CBC Lab Results  Component Value Date   WBC 11.4 (H) 04/05/2016   HGB 10.5 (L) 04/05/2016   HCT 33.5 (L) 04/05/2016   MCV 82 04/05/2016   PLT 266 04/05/2016    BMET    Component Value Date/Time   NA 140 04/05/2016 1441   K 4.8 04/05/2016 1441   CL 102 04/05/2016 1441   CO2 22 04/05/2016 1441   GLUCOSE 91 04/05/2016 1441   GLUCOSE 93 04/25/2015 0400   BUN 23 04/05/2016 1441   CREATININE 1.80 (H) 04/05/2016 1441   CALCIUM 9.4 04/05/2016 1441   GFRNONAA 27 (L) 04/05/2016 1441   GFRAA 32 (L) 04/05/2016 1441   CrCl cannot be calculated (Patient's most recent lab result is older than the maximum 21 days allowed.).  COAG No results found for: INR, PROTIME  Radiology No results  found.    Assessment/Plan 1. Pain in both lower extremities Recommend:  I do not find evidence of Vascular pathology that would explain the patient's symptoms  The patient has atypical pain symptoms for vascular disease  Noninvasive studies including venous ultrasound of the legs do not identify vascular problems  The patient should continue walking and begin a more formal exercise program. The patient should continue his antiplatelet therapy and aggressive treatment of the lipid abnormalities. The patient should begin wearing graduated compression socks 15-20 mmHg strength to control her mild edema.  Patient will follow-up with me on a PRN basis  Further work-up of her lower extremity pain is deferred to the primary service     2. Primary osteoarthritis, unspecified site Continue NSAID medications as already ordered, these medications have been reviewed and there are no changes at this time.  Continued activity and therapy was stressed.   3. Gastroesophageal reflux disease, esophagitis presence not specified Continue antihypertensive medications as already ordered, these medications have been reviewed and there are no changes at this time.  Avoidence of  caffeine and alcohol  Moderate elevation of the head of the bed   4. SVT (supraventricular tachycardia) (HCC) Continue antiarrhythmia medications as already ordered, these medications have been reviewed and there are no changes at this time.  Continue anticoagulation as ordered by Cardiology Service   5. Essential (primary) hypertension Continue antihypertensive medications as already ordered, these medications have been reviewed and there are no changes at this time.     Hortencia Pilar, MD  04/25/2017 7:07 AM

## 2017-05-03 ENCOUNTER — Other Ambulatory Visit: Payer: Self-pay | Admitting: Family Medicine

## 2017-12-31 ENCOUNTER — Other Ambulatory Visit: Payer: Self-pay | Admitting: Family Medicine

## 2017-12-31 ENCOUNTER — Ambulatory Visit
Admission: RE | Admit: 2017-12-31 | Discharge: 2017-12-31 | Disposition: A | Discharge status: Home / Self Care | Payer: Medicare HMO | Source: Ambulatory Visit | Attending: Family Medicine | Admitting: Family Medicine

## 2017-12-31 ENCOUNTER — Ambulatory Visit
Admission: RE | Admit: 2017-12-31 | Discharge: 2017-12-31 | Disposition: A | Discharge status: Home / Self Care | Payer: Medicare HMO | Attending: Family Medicine | Admitting: Family Medicine

## 2017-12-31 DIAGNOSIS — J441 Chronic obstructive pulmonary disease with (acute) exacerbation: Secondary | ICD-10-CM

## 2017-12-31 DIAGNOSIS — R0600 Dyspnea, unspecified: Secondary | ICD-10-CM

## 2018-01-09 ENCOUNTER — Ambulatory Visit
Admission: RE | Admit: 2018-01-09 | Discharge: 2018-01-09 | Disposition: A | Discharge status: Home / Self Care | Payer: Medicare HMO | Source: Ambulatory Visit | Attending: Family Medicine | Admitting: Family Medicine

## 2018-01-09 DIAGNOSIS — I10 Essential (primary) hypertension: Secondary | ICD-10-CM | POA: Diagnosis not present

## 2018-01-09 DIAGNOSIS — J449 Chronic obstructive pulmonary disease, unspecified: Secondary | ICD-10-CM | POA: Diagnosis not present

## 2018-01-09 DIAGNOSIS — R0600 Dyspnea, unspecified: Secondary | ICD-10-CM

## 2018-01-09 DIAGNOSIS — R0602 Shortness of breath: Secondary | ICD-10-CM | POA: Diagnosis not present

## 2018-01-09 NOTE — Progress Notes (Signed)
*  PRELIMINARY RESULTS* Echocardiogram 2D Echocardiogram has been performed.  Katie Conway 01/09/2018, 10:59 AM

## 2018-02-20 ENCOUNTER — Other Ambulatory Visit: Payer: Self-pay | Admitting: Family Medicine

## 2018-02-20 DIAGNOSIS — Z1231 Encounter for screening mammogram for malignant neoplasm of breast: Secondary | ICD-10-CM

## 2018-03-15 IMAGING — MG MM DIGITAL SCREENING BILAT W/ CAD
5 series · 5 of 5 positions shown · non-contrast
Comparison: Previous exam(s).

CLINICAL DATA: Screening.

EXAM:
DIGITAL SCREENING BILATERAL MAMMOGRAM WITH CAD

[R CC (1 of 2)]
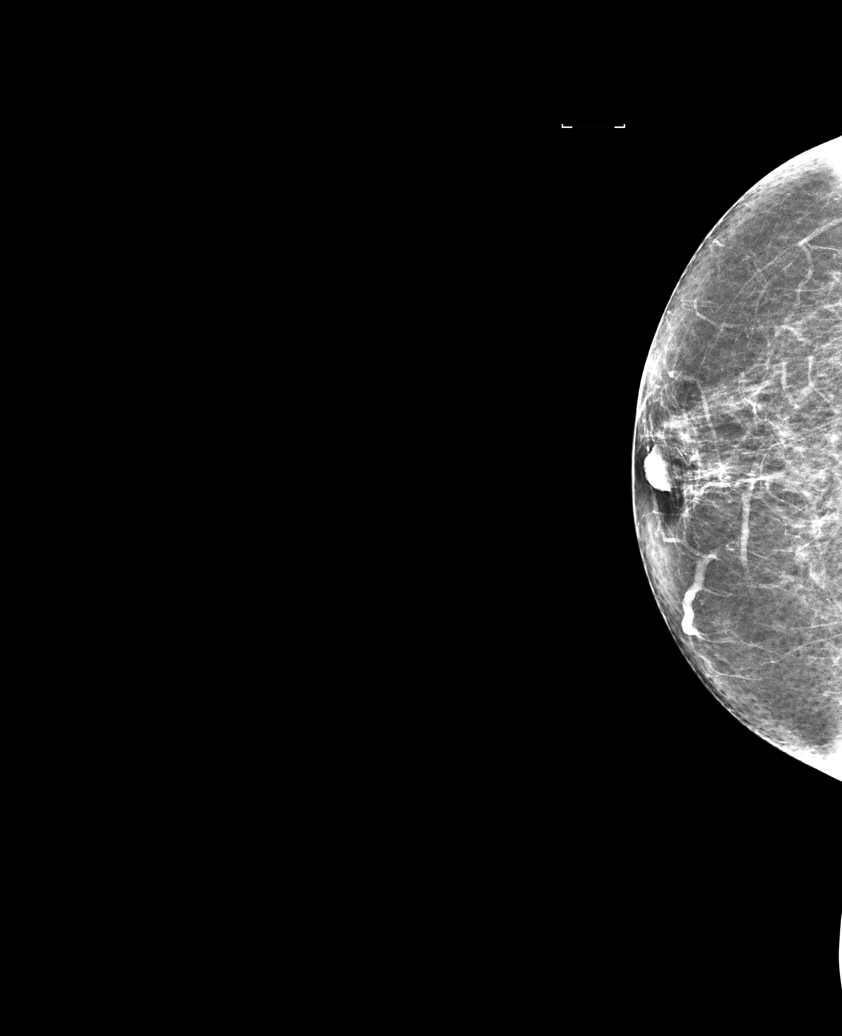

[L CC]
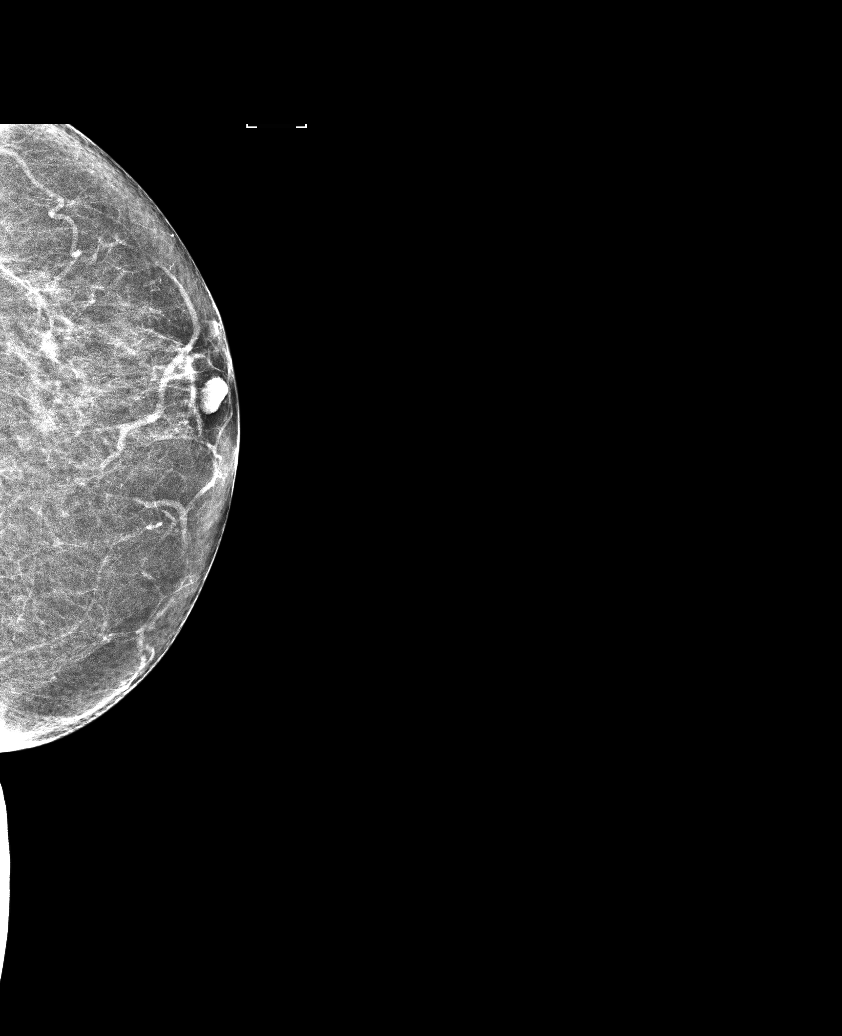

[R MLO]
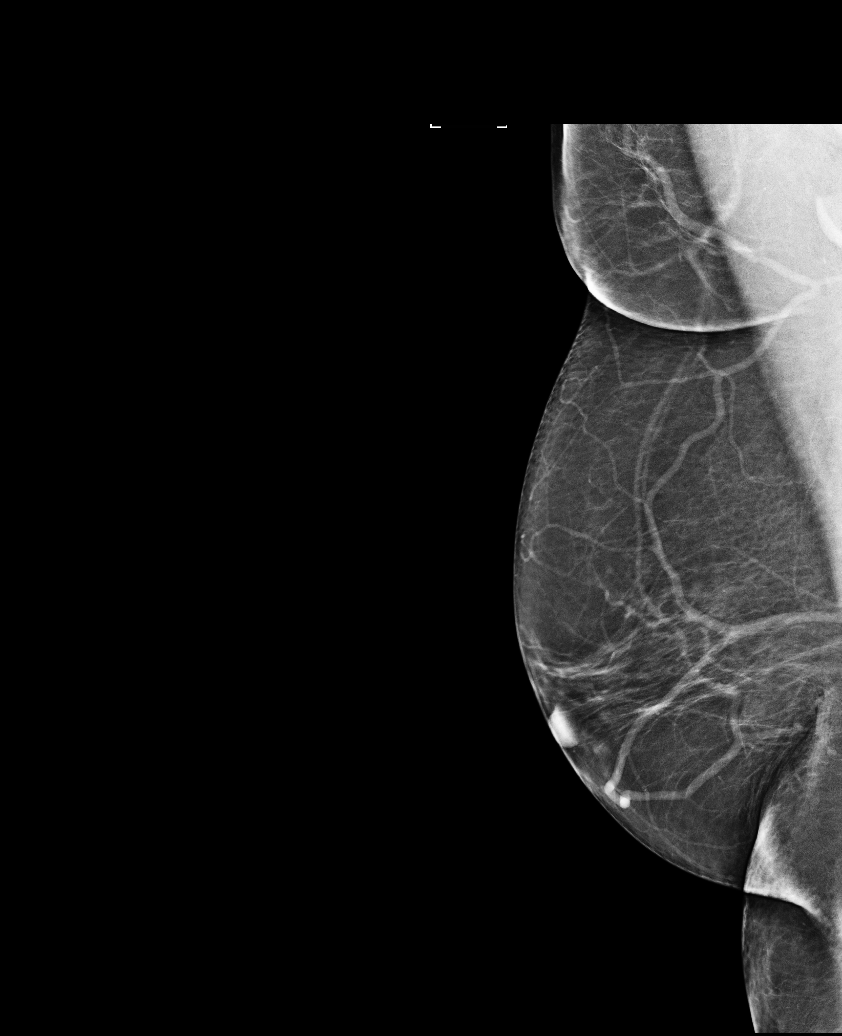

[R CC (2 of 2)]
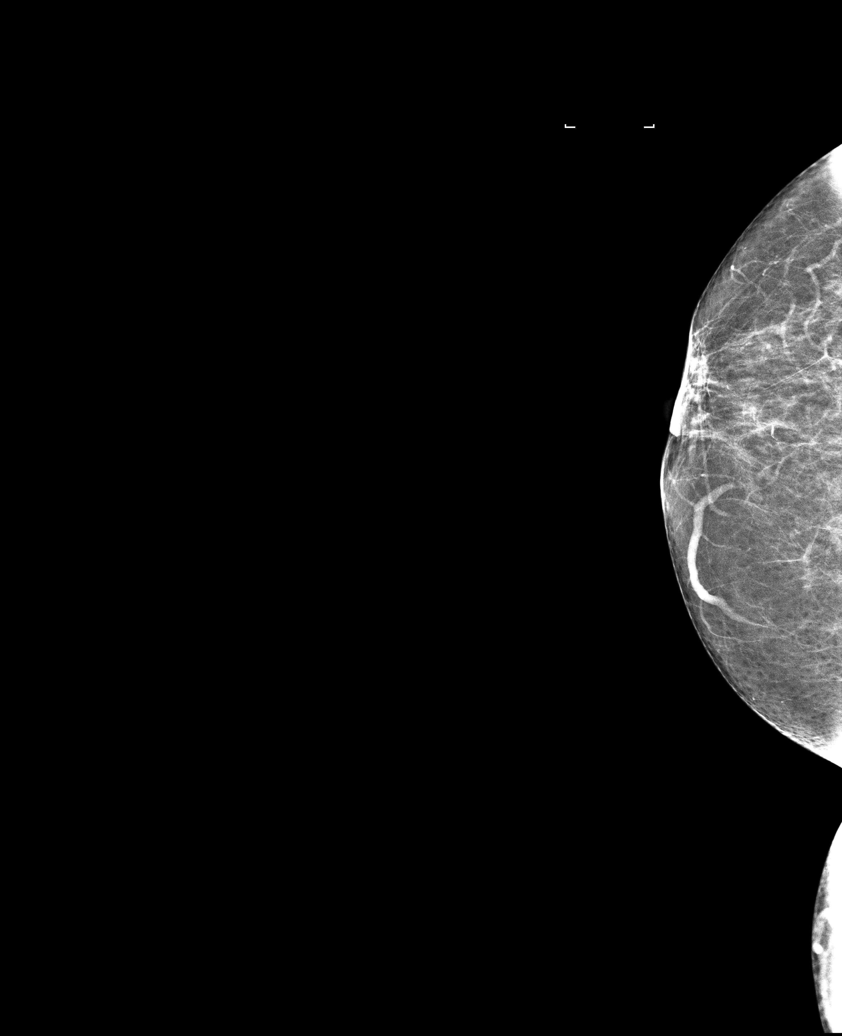

[L MLO]
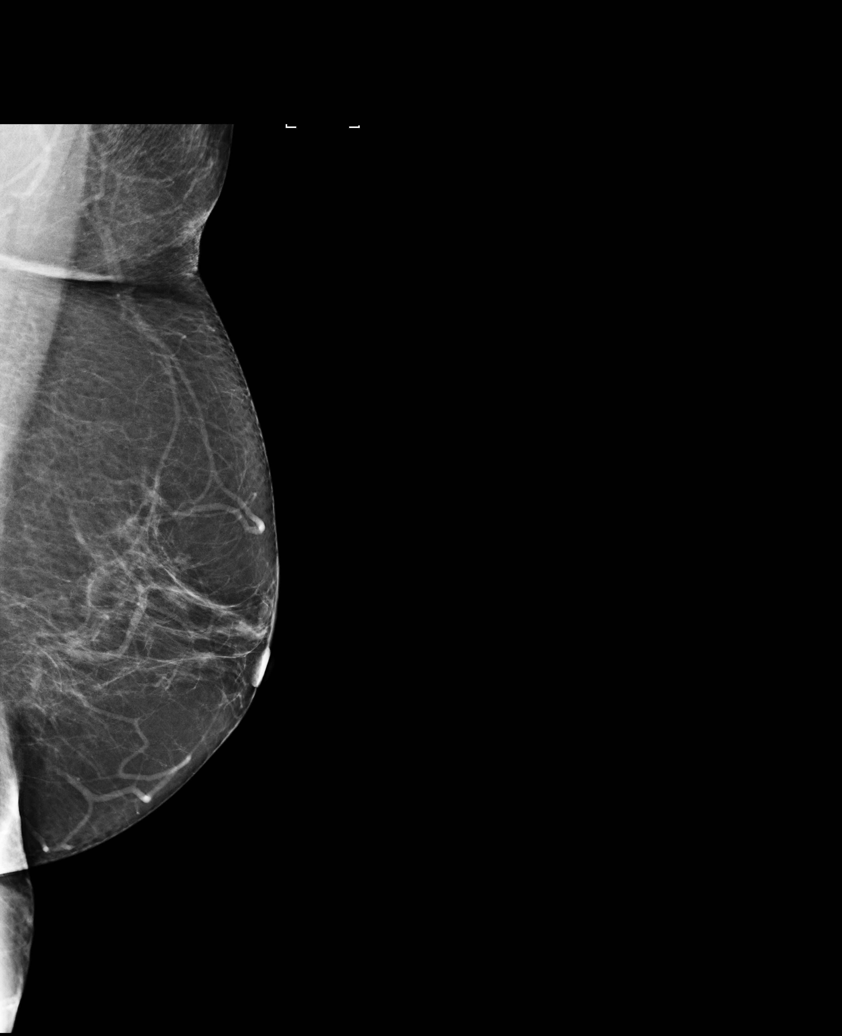

[5 of 5 positions shown; findings below may reference images not displayed]

ACR Breast Density Category b: There are scattered areas of
fibroglandular density.
FINDINGS: Limited evaluation secondary to difficulty in patient positioning.
There are no findings suspicious for malignancy. Images were
processed with CAD.
IMPRESSION: No mammographic evidence of malignancy. A result letter of this
screening mammogram will be mailed directly to the patient.

RECOMMENDATION:
Screening mammogram in one year. (Code:U7-F-PLM)

BI-RADS CATEGORY  1: Negative.

## 2018-03-24 ENCOUNTER — Ambulatory Visit
Admission: RE | Admit: 2018-03-24 | Discharge: 2018-03-24 | Disposition: A | Discharge status: Home / Self Care | Payer: Medicare HMO | Source: Ambulatory Visit | Attending: Family Medicine | Admitting: Family Medicine

## 2018-03-24 ENCOUNTER — Other Ambulatory Visit: Payer: Self-pay

## 2018-03-24 DIAGNOSIS — Z1231 Encounter for screening mammogram for malignant neoplasm of breast: Secondary | ICD-10-CM | POA: Diagnosis present

## 2018-09-06 ENCOUNTER — Other Ambulatory Visit: Payer: Self-pay | Admitting: Radiology

## 2018-09-06 DIAGNOSIS — Z20822 Contact with and (suspected) exposure to covid-19: Secondary | ICD-10-CM

## 2018-09-07 LAB — NOVEL CORONAVIRUS, NAA: SARS-CoV-2, NAA: NOT DETECTED

## 2018-09-10 ENCOUNTER — Telehealth: Payer: Self-pay | Admitting: General Practice

## 2018-09-10 NOTE — Telephone Encounter (Signed)
Negative COVID results given. Patient results "NOT Detected." Caller expressed understanding. ° °

## 2020-05-17 ENCOUNTER — Other Ambulatory Visit: Payer: Self-pay | Admitting: Family Medicine

## 2020-05-17 DIAGNOSIS — Z780 Asymptomatic menopausal state: Secondary | ICD-10-CM

## 2021-03-14 ENCOUNTER — Emergency Department: Payer: Medicare HMO

## 2021-03-14 ENCOUNTER — Observation Stay
Admission: EM | Admit: 2021-03-14 | Discharge: 2021-03-15 | Disposition: A | Discharge status: Home / Self Care | Payer: Medicare HMO | Attending: Internal Medicine | Admitting: Internal Medicine

## 2021-03-14 ENCOUNTER — Other Ambulatory Visit: Payer: Self-pay

## 2021-03-14 DIAGNOSIS — R0789 Other chest pain: Secondary | ICD-10-CM | POA: Insufficient documentation

## 2021-03-14 DIAGNOSIS — J441 Chronic obstructive pulmonary disease with (acute) exacerbation: Secondary | ICD-10-CM | POA: Diagnosis not present

## 2021-03-14 DIAGNOSIS — Z20822 Contact with and (suspected) exposure to covid-19: Secondary | ICD-10-CM | POA: Diagnosis not present

## 2021-03-14 DIAGNOSIS — R1013 Epigastric pain: Secondary | ICD-10-CM | POA: Insufficient documentation

## 2021-03-14 DIAGNOSIS — I129 Hypertensive chronic kidney disease with stage 1 through stage 4 chronic kidney disease, or unspecified chronic kidney disease: Secondary | ICD-10-CM | POA: Diagnosis not present

## 2021-03-14 DIAGNOSIS — Y92410 Unspecified street and highway as the place of occurrence of the external cause: Secondary | ICD-10-CM | POA: Insufficient documentation

## 2021-03-14 DIAGNOSIS — I10 Essential (primary) hypertension: Secondary | ICD-10-CM

## 2021-03-14 DIAGNOSIS — Z79899 Other long term (current) drug therapy: Secondary | ICD-10-CM | POA: Insufficient documentation

## 2021-03-14 DIAGNOSIS — Z87891 Personal history of nicotine dependence: Secondary | ICD-10-CM | POA: Diagnosis not present

## 2021-03-14 DIAGNOSIS — S0990XA Unspecified injury of head, initial encounter: Principal | ICD-10-CM | POA: Insufficient documentation

## 2021-03-14 DIAGNOSIS — N184 Chronic kidney disease, stage 4 (severe): Secondary | ICD-10-CM | POA: Diagnosis not present

## 2021-03-14 LAB — CBC
HCT: 34.9 % — ABNORMAL LOW (ref 36.0–46.0)
Hemoglobin: 10.8 g/dL — ABNORMAL LOW (ref 12.0–15.0)
MCH: 26 pg (ref 26.0–34.0)
MCHC: 30.9 g/dL (ref 30.0–36.0)
MCV: 84.1 fL (ref 80.0–100.0)
Platelets: 328 10*3/uL (ref 150–400)
RBC: 4.15 MIL/uL (ref 3.87–5.11)
RDW: 14.3 % (ref 11.5–15.5)
WBC: 13.9 10*3/uL — ABNORMAL HIGH (ref 4.0–10.5)
nRBC: 0 % (ref 0.0–0.2)

## 2021-03-14 LAB — COMPREHENSIVE METABOLIC PANEL
ALT: 13 U/L (ref 0–44)
AST: 20 U/L (ref 15–41)
Albumin: 4 g/dL (ref 3.5–5.0)
Alkaline Phosphatase: 75 U/L (ref 38–126)
Anion gap: 11 (ref 5–15)
BUN: 43 mg/dL — ABNORMAL HIGH (ref 8–23)
CO2: 21 mmol/L — ABNORMAL LOW (ref 22–32)
Calcium: 9.5 mg/dL (ref 8.9–10.3)
Chloride: 107 mmol/L (ref 98–111)
Creatinine, Ser: 1.98 mg/dL — ABNORMAL HIGH (ref 0.44–1.00)
GFR, Estimated: 25 mL/min — ABNORMAL LOW (ref >60)
Glucose, Bld: 104 mg/dL — ABNORMAL HIGH (ref 70–99)
Potassium: 4.8 mmol/L (ref 3.5–5.1)
Sodium: 139 mmol/L (ref 135–145)
Total Bilirubin: 0.7 mg/dL (ref 0.3–1.2)
Total Protein: 7.5 g/dL (ref 6.5–8.1)

## 2021-03-14 LAB — BLOOD GAS, VENOUS
Acid-base deficit: 4.1 mmol/L — ABNORMAL HIGH (ref 0.0–2.0)
Bicarbonate: 21.6 mmol/L (ref 20.0–28.0)
O2 Saturation: 70.2 %
Patient temperature: 37
pCO2, Ven: 41 mmHg — ABNORMAL LOW (ref 44–60)
pH, Ven: 7.33 (ref 7.25–7.43)
pO2, Ven: 42 mmHg (ref 32–45)

## 2021-03-14 LAB — TROPONIN I (HIGH SENSITIVITY): Troponin I (High Sensitivity): 9 ng/L (ref <18)

## 2021-03-14 MED ORDER — METHYLPREDNISOLONE SODIUM SUCC 125 MG IJ SOLR
125.0000 mg | Freq: Once | INTRAMUSCULAR | Status: AC
  Administered 2021-03-14: 125 mg via INTRAVENOUS
  Filled 2021-03-14: qty 2

## 2021-03-14 MED ORDER — IPRATROPIUM-ALBUTEROL 0.5-2.5 (3) MG/3ML IN SOLN: 9.0000 mL | Freq: Four times a day (QID) | RESPIRATORY_TRACT | Status: DC | PRN

## 2021-03-14 MED ORDER — ACETAMINOPHEN 325 MG PO TABS
650.0000 mg | ORAL_TABLET | Freq: Once | ORAL | Status: AC
  Administered 2021-03-14: 650 mg via ORAL
  Filled 2021-03-14: qty 2

## 2021-03-14 MED ORDER — IOHEXOL 300 MG/ML  SOLN
75.0000 mL | Freq: Once | INTRAMUSCULAR | Status: AC | PRN
Start: 2021-03-14 — End: 2021-03-14
  Administered 2021-03-14: 60 mL via INTRAVENOUS
  Filled 2021-03-14: qty 75

## 2021-03-14 MED ORDER — IPRATROPIUM-ALBUTEROL 0.5-2.5 (3) MG/3ML IN SOLN
3.0000 mL | Freq: Once | RESPIRATORY_TRACT | Status: AC
  Administered 2021-03-14: 3 mL via RESPIRATORY_TRACT
  Filled 2021-03-14: qty 3
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
## 2021-03-15 DIAGNOSIS — N184 Chronic kidney disease, stage 4 (severe): Secondary | ICD-10-CM

## 2021-03-15 DIAGNOSIS — J441 Chronic obstructive pulmonary disease with (acute) exacerbation: Secondary | ICD-10-CM | POA: Diagnosis not present

## 2021-03-15 DIAGNOSIS — R0789 Other chest pain: Secondary | ICD-10-CM

## 2021-03-15 LAB — RESP PANEL BY RT-PCR (FLU A&B, COVID) ARPGX2
Influenza A by PCR: NEGATIVE
Influenza B by PCR: NEGATIVE
SARS Coronavirus 2 by RT PCR: NEGATIVE

## 2021-03-15 MED ORDER — ACETAMINOPHEN 325 MG RE SUPP: 650.0000 mg | Freq: Four times a day (QID) | RECTAL | Status: DC | PRN

## 2021-03-15 MED ORDER — METHYLPREDNISOLONE SODIUM SUCC 40 MG IJ SOLR
40.0000 mg | Freq: Two times a day (BID) | INTRAMUSCULAR | Status: DC
  Administered 2021-03-15: 40 mg via INTRAVENOUS
  Filled 2021-03-15: qty 1

## 2021-03-15 MED ORDER — PREDNISONE 20 MG PO TABS: 40.0000 mg | ORAL_TABLET | Freq: Every day | ORAL | 0 refills | Status: AC

## 2021-03-15 MED ORDER — ALBUTEROL SULFATE (2.5 MG/3ML) 0.083% IN NEBU: 2.5000 mg | INHALATION_SOLUTION | RESPIRATORY_TRACT | Status: DC | PRN

## 2021-03-15 MED ORDER — IPRATROPIUM-ALBUTEROL 0.5-2.5 (3) MG/3ML IN SOLN
RESPIRATORY_TRACT | Status: AC
  Filled 2021-03-15: qty 3

## 2021-03-15 MED ORDER — HYDROCODONE-ACETAMINOPHEN 5-325 MG PO TABS: 1.0000 | ORAL_TABLET | ORAL | Status: DC | PRN

## 2021-03-15 MED ORDER — ONDANSETRON HCL 4 MG PO TABS: 4.0000 mg | ORAL_TABLET | Freq: Four times a day (QID) | ORAL | Status: DC | PRN

## 2021-03-15 MED ORDER — LOSARTAN POTASSIUM 50 MG PO TABS: 25.0000 mg | ORAL_TABLET | Freq: Every day | ORAL | Status: DC

## 2021-03-15 MED ORDER — METOPROLOL SUCCINATE ER 50 MG PO TB24
25.0000 mg | ORAL_TABLET | Freq: Every day | ORAL | Status: DC
Start: 2021-03-15 — End: 2021-03-15

## 2021-03-15 MED ORDER — ENOXAPARIN SODIUM 40 MG/0.4ML IJ SOSY: 40.0000 mg | PREFILLED_SYRINGE | INTRAMUSCULAR | Status: DC

## 2021-03-15 MED ORDER — PREDNISONE 5 MG PO TABS: 5.0000 mg | ORAL_TABLET | Freq: Every day | ORAL | Status: AC

## 2021-03-15 MED ORDER — ACETAMINOPHEN 325 MG PO TABS: 650.0000 mg | ORAL_TABLET | Freq: Four times a day (QID) | ORAL | Status: DC | PRN

## 2021-03-15 MED ORDER — IPRATROPIUM-ALBUTEROL 0.5-2.5 (3) MG/3ML IN SOLN
3.0000 mL | Freq: Four times a day (QID) | RESPIRATORY_TRACT | Status: DC
  Administered 2021-03-15 (×2): 3 mL via RESPIRATORY_TRACT
  Filled 2021-03-15 (×2): qty 3

## 2021-03-15 MED ORDER — PREDNISONE 20 MG PO TABS: 40.0000 mg | ORAL_TABLET | Freq: Every day | ORAL | Status: DC

## 2021-03-15 MED ORDER — ONDANSETRON HCL 4 MG/2ML IJ SOLN: 4.0000 mg | Freq: Four times a day (QID) | INTRAMUSCULAR | Status: DC | PRN

## 2021-03-15 NOTE — Assessment & Plan Note (Signed)
DuoNebs every 6 and as needed IV Solu-Medrol
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
## 2021-03-15 NOTE — ED Notes (Signed)
D/C and reasons to return to ED discussed with pt, pt verbalized understanding. NAD noted. Pt refused D/C vitals. Pt educated on where to pick up RX as well, pt denies any questions about this medication since she takes it daily.

## 2021-03-15 NOTE — Assessment & Plan Note (Signed)
Uncontrolled, possibly related to pain Resume home losartan, metoprolol and hydrochlorothiazide

## 2021-03-15 NOTE — ED Notes (Signed)
Pt refusing vitals at this time due to waiting for a ride at this time.

## 2021-03-15 NOTE — Assessment & Plan Note (Signed)
At baseline 
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
## 2021-03-15 NOTE — ED Notes (Signed)
Breakfast tray given to pt 

## 2021-03-15 NOTE — ED Notes (Signed)
Dietary called for breakfast tray.

## 2021-03-15 NOTE — Assessment & Plan Note (Signed)
Pan scan in the ED negative for acute trauma

## 2021-03-15 NOTE — ED Notes (Signed)
Pt resting at this time. Pt ambulatory to bedside toilet. NAD noted. Call bell in reach  Toothbrush and toothpaste applied as well as comb.

## 2021-03-15 NOTE — ED Notes (Signed)
Pt refusing vitals at this time.

## 2021-03-27 ENCOUNTER — Ambulatory Visit
Admission: RE | Admit: 2021-03-27 | Discharge: 2021-03-27 | Disposition: A | Discharge status: Home / Self Care | Payer: Medicare HMO | Attending: Family Medicine | Admitting: Family Medicine

## 2021-03-27 ENCOUNTER — Other Ambulatory Visit: Payer: Self-pay | Admitting: Family Medicine

## 2021-03-27 ENCOUNTER — Ambulatory Visit
Admission: RE | Admit: 2021-03-27 | Discharge: 2021-03-27 | Disposition: A | Discharge status: Home / Self Care | Payer: Medicare HMO | Source: Ambulatory Visit | Attending: Family Medicine | Admitting: Family Medicine

## 2021-03-27 ENCOUNTER — Other Ambulatory Visit: Payer: Self-pay

## 2021-03-27 DIAGNOSIS — M545 Low back pain, unspecified: Secondary | ICD-10-CM | POA: Diagnosis present

## 2021-09-27 ENCOUNTER — Ambulatory Visit
Admission: RE | Admit: 2021-09-27 | Discharge: 2021-09-27 | Disposition: A | Discharge status: Home / Self Care | Payer: Medicare HMO | Source: Ambulatory Visit | Attending: Family Medicine | Admitting: Family Medicine

## 2021-09-27 ENCOUNTER — Ambulatory Visit
Admission: RE | Admit: 2021-09-27 | Discharge: 2021-09-27 | Disposition: A | Discharge status: Home / Self Care | Payer: Medicare HMO | Attending: Family Medicine | Admitting: Family Medicine

## 2021-09-27 ENCOUNTER — Other Ambulatory Visit: Payer: Self-pay | Admitting: Family Medicine

## 2021-09-27 DIAGNOSIS — M25561 Pain in right knee: Secondary | ICD-10-CM | POA: Insufficient documentation

## 2021-09-27 DIAGNOSIS — G8929 Other chronic pain: Secondary | ICD-10-CM | POA: Insufficient documentation

## 2022-04-24 ENCOUNTER — Inpatient Hospital Stay: Payer: Medicare HMO

## 2022-04-24 ENCOUNTER — Encounter: Payer: Self-pay | Admitting: Internal Medicine

## 2022-04-24 ENCOUNTER — Inpatient Hospital Stay: Payer: Medicare HMO | Attending: Internal Medicine | Admitting: Internal Medicine

## 2022-04-24 VITALS — BP 117/66 | HR 81 | Temp 95.6°F | Resp 18 | Wt 101.4 lb

## 2022-04-24 DIAGNOSIS — Z803 Family history of malignant neoplasm of breast: Secondary | ICD-10-CM | POA: Insufficient documentation

## 2022-04-24 DIAGNOSIS — Z886 Allergy status to analgesic agent status: Secondary | ICD-10-CM | POA: Insufficient documentation

## 2022-04-24 DIAGNOSIS — Z841 Family history of disorders of kidney and ureter: Secondary | ICD-10-CM | POA: Insufficient documentation

## 2022-04-24 DIAGNOSIS — R0602 Shortness of breath: Secondary | ICD-10-CM | POA: Diagnosis not present

## 2022-04-24 DIAGNOSIS — I129 Hypertensive chronic kidney disease with stage 1 through stage 4 chronic kidney disease, or unspecified chronic kidney disease: Secondary | ICD-10-CM | POA: Insufficient documentation

## 2022-04-24 DIAGNOSIS — R252 Cramp and spasm: Secondary | ICD-10-CM | POA: Insufficient documentation

## 2022-04-24 DIAGNOSIS — N184 Chronic kidney disease, stage 4 (severe): Secondary | ICD-10-CM | POA: Insufficient documentation

## 2022-04-24 DIAGNOSIS — Z888 Allergy status to other drugs, medicaments and biological substances status: Secondary | ICD-10-CM | POA: Diagnosis not present

## 2022-04-24 DIAGNOSIS — D649 Anemia, unspecified: Secondary | ICD-10-CM

## 2022-04-24 DIAGNOSIS — K5900 Constipation, unspecified: Secondary | ICD-10-CM | POA: Diagnosis not present

## 2022-04-24 DIAGNOSIS — Z79899 Other long term (current) drug therapy: Secondary | ICD-10-CM | POA: Insufficient documentation

## 2022-04-24 DIAGNOSIS — Z87891 Personal history of nicotine dependence: Secondary | ICD-10-CM | POA: Insufficient documentation

## 2022-04-24 DIAGNOSIS — K219 Gastro-esophageal reflux disease without esophagitis: Secondary | ICD-10-CM | POA: Insufficient documentation

## 2022-04-24 DIAGNOSIS — J449 Chronic obstructive pulmonary disease, unspecified: Secondary | ICD-10-CM | POA: Insufficient documentation

## 2022-04-24 DIAGNOSIS — M1990 Unspecified osteoarthritis, unspecified site: Secondary | ICD-10-CM | POA: Insufficient documentation

## 2022-04-24 DIAGNOSIS — Z880 Allergy status to penicillin: Secondary | ICD-10-CM | POA: Diagnosis not present

## 2022-04-24 DIAGNOSIS — Z8249 Family history of ischemic heart disease and other diseases of the circulatory system: Secondary | ICD-10-CM | POA: Diagnosis not present

## 2022-04-24 DIAGNOSIS — M79604 Pain in right leg: Secondary | ICD-10-CM

## 2022-04-24 DIAGNOSIS — Z825 Family history of asthma and other chronic lower respiratory diseases: Secondary | ICD-10-CM | POA: Diagnosis not present

## 2022-04-24 LAB — CBC WITH DIFFERENTIAL/PLATELET
Abs Immature Granulocytes: 0.02 10*3/uL (ref 0.00–0.07)
Basophils Absolute: 0.1 10*3/uL (ref 0.0–0.1)
Basophils Relative: 1 %
Eosinophils Absolute: 0.4 10*3/uL (ref 0.0–0.5)
Eosinophils Relative: 4 %
HCT: 31.2 % — ABNORMAL LOW (ref 36.0–46.0)
Hemoglobin: 9.8 g/dL — ABNORMAL LOW (ref 12.0–15.0)
Immature Granulocytes: 0 %
Lymphocytes Relative: 20 %
Lymphs Abs: 1.8 10*3/uL (ref 0.7–4.0)
MCH: 26.6 pg (ref 26.0–34.0)
MCHC: 31.4 g/dL (ref 30.0–36.0)
MCV: 84.8 fL (ref 80.0–100.0)
Monocytes Absolute: 0.5 10*3/uL (ref 0.1–1.0)
Monocytes Relative: 6 %
Neutro Abs: 6.2 10*3/uL (ref 1.7–7.7)
Neutrophils Relative %: 69 %
Platelets: 291 10*3/uL (ref 150–400)
RBC: 3.68 MIL/uL — ABNORMAL LOW (ref 3.87–5.11)
RDW: 15.4 % (ref 11.5–15.5)
WBC: 8.9 10*3/uL (ref 4.0–10.5)
nRBC: 0 % (ref 0.0–0.2)

## 2022-04-24 LAB — IRON AND TIBC
Iron: 58 ug/dL (ref 28–170)
Saturation Ratios: 21 % (ref 10.4–31.8)
TIBC: 276 ug/dL (ref 250–450)
UIBC: 218 ug/dL

## 2022-04-24 LAB — FOLATE: Folate: 25 ng/mL (ref >5.9)

## 2022-04-24 LAB — MAGNESIUM: Magnesium: 1.9 mg/dL (ref 1.7–2.4)

## 2022-04-24 LAB — VITAMIN B12: Vitamin B-12: 791 pg/mL (ref 180–914)

## 2022-04-24 NOTE — Progress Notes (Signed)
Patient is doing well other than having an asthma attack on the way to the room .

## 2022-04-24 NOTE — Progress Notes (Signed)
[name]  [phone] [phone]  ID: Corrie Dandy OB: 1940/08/12  MR#: [id number]  [id number]  Patient Care Team: Sharyne Peach, MD as PCP - General (Family Medicine)  REFERRING PROVIDER: Dr. Holley Raring  REASON FOR REFERRAL: Anemia  HPI: Katie Conway is a 82 y.o. female with past medical history of COPD, GERD, hypertension, osteoporosis, CKD stage IV, chronic anemia, arthritis was referred to hematology for workup of anemia.  Patient follows with Dr. Holley Raring for chronic kidney disease.  She has been anemic since 2017 and recently it has been progressing ranging in 9.  Occasionally takes oral iron because it makes her constipated.  Her energy levels are low which is chronic.  Her appetite also decreased about a year ago and has not come back.  Reports weight loss of about 40 pounds in the past 1 year.  Gets shortness of breath on exertion.  Reports cough which is new for past few days.  Thinks it might be related to allergies.  It is not bothering her.  She feels as if she will wants to bring up the phlegm but nothing comes out.  She is using nebulizer and Vicks.  Follows with Dr. Wynonia Musty pulmonary at [facility].  REVIEW OF SYSTEMS:   ROS  As per HPI. Otherwise, a complete review of systems is negative.  PAST MEDICAL HISTORY: Past Medical History:  Diagnosis Date   Arthritis    Knees   Chronic bronchitis    COPD (chronic obstructive pulmonary disease)    Diverticulosis    GERD (gastroesophageal reflux disease)    Hypertension    Osteoporosis    Renal insufficiency    Shortness of breath dyspnea    with exertion   Wears dentures    Full - upper.  Partial - Lower   Wears dentures full upper and partial lower    PAST SURGICAL HISTORY: Past Surgical History:  Procedure Laterality Date   CATARACT EXTRACTION W/PHACO Left 06/02/2014   Procedure: CATARACT EXTRACTION PHACO AND INTRAOCULAR LENS PLACEMENT (IOC);  Surgeon:  Leandrew Koyanagi, MD;  Location: [place];  Service: Ophthalmology;  Laterality: Left;   CATARACT EXTRACTION W/PHACO Right 02/16/2015   Procedure: CATARACT EXTRACTION PHACO AND INTRAOCULAR LENS PLACEMENT (IOC);  Surgeon: Leandrew Koyanagi, MD;  Location: [place];  Service: Ophthalmology;  Laterality: Right;   CESAREAN SECTION      FAMILY HISTORY: Family History  Problem Relation Age of Onset   Heart failure Mother    Heart attack Father    COPD Brother    COPD Brother    Kidney failure Brother    Breast cancer Other 9    HEALTH MAINTENANCE: Social History   Tobacco Use   Smoking status: Former    Types: Cigarettes    Quit date: 01/23/2011    Years since quitting: 11.2   Smokeless tobacco: Never   Tobacco comments:    Previous smoker; 1 pack a day for 50 years...  Vaping Use   Vaping Use: Never used  Substance Use Topics   Alcohol use: No    Comment: glass wine 2-3 times per year   Drug use: No     Allergies  Allergen Reactions   Amoxicillin Other (See Comments)    Hair loss Hair loss   Aspirin Other (See Comments)    GI Upset   Lisinopril Other (See Comments)    Patient states this medication made her very weak.   Amlodipine Rash and Other (See Comments)  Fatigue     Current Outpatient Medications  Medication Sig Dispense Refill   albuterol (PROVENTIL HFA;VENTOLIN HFA) 108 (90 Base) MCG/ACT inhaler Inhale into the lungs.     calcitRIOL (ROCALTROL) 0.25 MCG capsule Take by mouth.     famotidine (PEPCID) 20 MG tablet Take 1 tablet by mouth 2 (two) times daily.     fluticasone-salmeterol (ADVAIR) 100-50 MCG/ACT AEPB Inhale 1 puff into the lungs 2 (two) times daily.     hydrALAZINE (APRESOLINE) 25 MG tablet Take 25 mg by mouth in the morning and at bedtime.     hydrochlorothiazide (HYDRODIURIL) 12.5 MG tablet TAKE 1 TABLET BY MOUTH ONCE DAILY 90 tablet 3   ipratropium-albuterol (DUONEB) 0.5-2.5 (3) MG/3ML SOLN Take 3 mLs by  nebulization 4 (four) times daily as needed (for wheezing/shortness of breath.).      losartan (COZAAR) 25 MG tablet Take 1 tablet (25 mg total) by mouth daily. 90 tablet 3   metoprolol succinate (TOPROL-XL) 25 MG 24 hr tablet TAKE ONE TABLET BY MOUTH ONCE DAILY     montelukast (SINGULAIR) 10 MG tablet Take 10 mg by mouth at bedtime.     azithromycin (ZITHROMAX) 250 MG tablet Take 250 mg by mouth daily. (Patient not taking: Reported on 04/24/2022)     benzonatate (TESSALON) 200 MG capsule Take 200 mg by mouth 3 (three) times daily as needed for cough. For up to 7 days. (Patient not taking: Reported on 04/24/2022)     Calcium Carb-Cholecalciferol 600-10 MG-MCG TABS Take by mouth. (Patient not taking: Reported on 04/24/2022)     fluticasone (FLONASE) 50 MCG/ACT nasal spray Place into the nose. (Patient not taking: Reported on 04/24/2022)     montelukast (SINGULAIR) 10 MG tablet Take 1 tablet by mouth daily.     Omega-3 Fatty Acids (FISH OIL) 1000 MG CAPS Take 1,000 mg by mouth daily. (Patient not taking: Reported on 04/24/2022)     predniSONE (DELTASONE) 5 MG tablet Take 1 tablet (5 mg total) by mouth daily with breakfast. Restart after finishing 4 days course of prednisone (Patient not taking: Reported on 04/24/2022)     TRELEGY ELLIPTA 100-62.5-25 MCG/ACT AEPB Inhale 1 puff into the lungs daily. (Patient not taking: Reported on 04/24/2022)     No current facility-administered medications for this visit.    OBJECTIVE: Vitals:   04/24/22 1119  BP: 117/66  Pulse: 81  Resp: 18  Temp: (!) 95.6 F (35.3 C)  SpO2: 99%     Body mass index is 20.48 kg/m.      General: Well-developed, well-nourished, no acute distress. Eyes: Pink conjunctiva, anicteric sclera. HEENT: Normocephalic, moist mucous membranes, clear oropharnyx. Lungs: Clear to auscultation bilaterally. Heart: Regular rate and rhythm. No rubs, murmurs, or gallops. Abdomen: Soft, nontender, nondistended. No organomegaly noted, normoactive bowel  sounds. Musculoskeletal: No edema, cyanosis, or clubbing. Neuro: Alert, answering all questions appropriately. Cranial nerves grossly intact. Skin: No rashes or petechiae noted. Psych: Normal affect. Lymphatics: No cervical, calvicular, axillary or inguinal LAD.   LAB RESULTS:  Lab Results  Component Value Date   NA 139 03/14/2021   K 4.8 03/14/2021   CL 107 03/14/2021   CO2 21 (L) 03/14/2021   GLUCOSE 104 (H) 03/14/2021   BUN 43 (H) 03/14/2021   CREATININE 1.98 (H) 03/14/2021   CALCIUM 9.5 03/14/2021   PROT 7.5 03/14/2021   ALBUMIN 4.0 03/14/2021   AST 20 03/14/2021   ALT 13 03/14/2021   ALKPHOS 75 03/14/2021   BILITOT 0.7 03/14/2021  GFRNONAA 25 (L) 03/14/2021   GFRAA 32 (L) 04/05/2016    Lab Results  Component Value Date   WBC 8.9 04/24/2022   NEUTROABS 6.2 04/24/2022   HGB 9.8 (L) 04/24/2022   HCT 31.2 (L) 04/24/2022   MCV 84.8 04/24/2022   PLT 291 04/24/2022    Lab Results  Component Value Date   FERRITIN 280 (H) 04/10/2016     STUDIES: No results found.  ASSESSMENT AND PLAN:   Katie Conway is a 82 y.o. female with pmh of COPD, GERD, hypertension, osteoporosis, CKD stage IV, chronic anemia, arthritis was referred to hematology for workup of anemia.  # Normocytic anemia # CKD stage IV -Progressive.  She has been anemic since 2017.  Recently anemia has worsened in the range of 9.  -Labs reviewed from 04/12/2022.  CBC showed hemoglobin of 9.8.  Ferritin 329.  Her anemia is likely secondary to chronic kidney disease.  However I will obtain iron panel, B12 and folate to complete the workup.  We discussed about Retacrit injections to help with the anemia.  Potential side effects such as increased risk of thromboembolic events, cardiovascular events and stroke with hemoglobin more than 11 was discussed.  Can also cause elevation in blood pressure.  Goal hemoglobin between 9-10.  Currently she is maintaining her hemoglobin in high 9.  I would like to hold  off on EPO injections.  I will follow-up with her again in 3 months for monitoring.    COPD-follows with Dr. Wynonia Musty with pulmonary.  On DuoNeb, Advair, albuterol  Hypertension-on metoprolol, HCTZ  Proteinuria-on losartan for renal protection  Leg cramp-will check magnesium level.  Potassium level was normal.  Orders Placed This Encounter  Procedures   CBC with Differential/Platelet   Iron and TIBC   Vitamin B12   Folate   Magnesium    Labs today. RTC in 3 months for MD visit, labs, possible Retacrit.  Patient expressed understanding and was in agreement with this plan. She also understands that She can call clinic at any time with any questions, concerns, or complaints.   I spent a total of 45 minutes reviewing chart data, face-to-face evaluation with the patient, counseling and coordination of care as detailed above.  Jane Canary, MD   04/24/2022 1:06 PM

## 2022-07-23 ENCOUNTER — Other Ambulatory Visit: Payer: Self-pay | Admitting: *Deleted

## 2022-07-23 DIAGNOSIS — D649 Anemia, unspecified: Secondary | ICD-10-CM

## 2022-07-24 ENCOUNTER — Inpatient Hospital Stay: Payer: Medicare HMO | Admitting: Internal Medicine

## 2022-07-24 ENCOUNTER — Inpatient Hospital Stay: Payer: Medicare HMO

## 2022-07-24 ENCOUNTER — Encounter: Payer: Self-pay | Admitting: Internal Medicine

## 2022-07-24 ENCOUNTER — Inpatient Hospital Stay: Payer: Medicare HMO | Attending: Internal Medicine

## 2023-10-29 ENCOUNTER — Other Ambulatory Visit: Payer: Self-pay | Admitting: Family Medicine

## 2023-10-29 DIAGNOSIS — R413 Other amnesia: Secondary | ICD-10-CM

## 2023-10-29 DIAGNOSIS — I10 Essential (primary) hypertension: Secondary | ICD-10-CM

## 2023-11-05 ENCOUNTER — Ambulatory Visit
Admission: RE | Admit: 2023-11-05 | Discharge: 2023-11-05 | Disposition: A | Discharge status: Home / Self Care | Source: Ambulatory Visit | Attending: Family Medicine | Admitting: Family Medicine

## 2023-11-05 DIAGNOSIS — R413 Other amnesia: Secondary | ICD-10-CM | POA: Insufficient documentation

## 2023-11-05 DIAGNOSIS — I10 Essential (primary) hypertension: Secondary | ICD-10-CM | POA: Diagnosis present

## 2024-02-18 ENCOUNTER — Other Ambulatory Visit: Payer: Self-pay | Admitting: *Deleted

## 2024-02-18 DIAGNOSIS — D649 Anemia, unspecified: Secondary | ICD-10-CM

## 2024-02-19 ENCOUNTER — Inpatient Hospital Stay: Attending: Nurse Practitioner

## 2024-02-19 ENCOUNTER — Inpatient Hospital Stay: Admitting: Nurse Practitioner

## 2024-02-19 ENCOUNTER — Encounter: Payer: Self-pay | Admitting: Nurse Practitioner

## 2024-02-19 VITALS — BP 139/85 | HR 92 | Temp 98.4°F | Resp 20 | Wt 98.0 lb

## 2024-02-19 DIAGNOSIS — Z87891 Personal history of nicotine dependence: Secondary | ICD-10-CM | POA: Insufficient documentation

## 2024-02-19 DIAGNOSIS — Z888 Allergy status to other drugs, medicaments and biological substances status: Secondary | ICD-10-CM | POA: Insufficient documentation

## 2024-02-19 DIAGNOSIS — N184 Chronic kidney disease, stage 4 (severe): Secondary | ICD-10-CM | POA: Insufficient documentation

## 2024-02-19 DIAGNOSIS — Z880 Allergy status to penicillin: Secondary | ICD-10-CM | POA: Insufficient documentation

## 2024-02-19 DIAGNOSIS — Z9841 Cataract extraction status, right eye: Secondary | ICD-10-CM | POA: Insufficient documentation

## 2024-02-19 DIAGNOSIS — D631 Anemia in chronic kidney disease: Secondary | ICD-10-CM | POA: Diagnosis not present

## 2024-02-19 DIAGNOSIS — Z9842 Cataract extraction status, left eye: Secondary | ICD-10-CM | POA: Insufficient documentation

## 2024-02-19 DIAGNOSIS — Z803 Family history of malignant neoplasm of breast: Secondary | ICD-10-CM | POA: Insufficient documentation

## 2024-02-19 DIAGNOSIS — R0602 Shortness of breath: Secondary | ICD-10-CM | POA: Insufficient documentation

## 2024-02-19 DIAGNOSIS — J4489 Other specified chronic obstructive pulmonary disease: Secondary | ICD-10-CM | POA: Insufficient documentation

## 2024-02-19 DIAGNOSIS — K5900 Constipation, unspecified: Secondary | ICD-10-CM | POA: Insufficient documentation

## 2024-02-19 DIAGNOSIS — Z8249 Family history of ischemic heart disease and other diseases of the circulatory system: Secondary | ICD-10-CM | POA: Insufficient documentation

## 2024-02-19 DIAGNOSIS — J449 Chronic obstructive pulmonary disease, unspecified: Secondary | ICD-10-CM

## 2024-02-19 DIAGNOSIS — I129 Hypertensive chronic kidney disease with stage 1 through stage 4 chronic kidney disease, or unspecified chronic kidney disease: Secondary | ICD-10-CM | POA: Insufficient documentation

## 2024-02-19 DIAGNOSIS — Z79899 Other long term (current) drug therapy: Secondary | ICD-10-CM | POA: Insufficient documentation

## 2024-02-19 DIAGNOSIS — Z8419 Family history of other disorders of kidney and ureter: Secondary | ICD-10-CM | POA: Insufficient documentation

## 2024-02-19 DIAGNOSIS — N189 Chronic kidney disease, unspecified: Secondary | ICD-10-CM

## 2024-02-19 DIAGNOSIS — Z825 Family history of asthma and other chronic lower respiratory diseases: Secondary | ICD-10-CM | POA: Insufficient documentation

## 2024-02-19 DIAGNOSIS — M1990 Unspecified osteoarthritis, unspecified site: Secondary | ICD-10-CM | POA: Insufficient documentation

## 2024-02-19 DIAGNOSIS — M810 Age-related osteoporosis without current pathological fracture: Secondary | ICD-10-CM | POA: Insufficient documentation

## 2024-02-19 DIAGNOSIS — Z886 Allergy status to analgesic agent status: Secondary | ICD-10-CM | POA: Insufficient documentation

## 2024-02-19 DIAGNOSIS — K219 Gastro-esophageal reflux disease without esophagitis: Secondary | ICD-10-CM | POA: Insufficient documentation

## 2024-02-19 DIAGNOSIS — D649 Anemia, unspecified: Secondary | ICD-10-CM | POA: Insufficient documentation

## 2024-02-19 LAB — CBC WITH DIFFERENTIAL (CANCER CENTER ONLY)
Abs Immature Granulocytes: 0.04 10*3/uL (ref 0.00–0.07)
Basophils Absolute: 0.1 10*3/uL (ref 0.0–0.1)
Basophils Relative: 1 %
Eosinophils Absolute: 0.2 10*3/uL (ref 0.0–0.5)
Eosinophils Relative: 2 %
HCT: 32 % — ABNORMAL LOW (ref 36.0–46.0)
Hemoglobin: 10 g/dL — ABNORMAL LOW (ref 12.0–15.0)
Immature Granulocytes: 0 %
Lymphocytes Relative: 16 %
Lymphs Abs: 1.5 10*3/uL (ref 0.7–4.0)
MCH: 26.7 pg (ref 26.0–34.0)
MCHC: 31.3 g/dL (ref 30.0–36.0)
MCV: 85.3 fL (ref 80.0–100.0)
Monocytes Absolute: 0.6 10*3/uL (ref 0.1–1.0)
Monocytes Relative: 6 %
Neutro Abs: 7.1 10*3/uL (ref 1.7–7.7)
Neutrophils Relative %: 75 %
Platelet Count: 233 10*3/uL (ref 150–400)
RBC: 3.75 MIL/uL — ABNORMAL LOW (ref 3.87–5.11)
RDW: 14.1 % (ref 11.5–15.5)
WBC Count: 9.4 10*3/uL (ref 4.0–10.5)
nRBC: 0 % (ref 0.0–0.2)

## 2024-02-19 LAB — IRON AND TIBC
Iron: 46 ug/dL (ref 28–170)
Saturation Ratios: 18 % (ref 10.4–31.8)
TIBC: 249 ug/dL — ABNORMAL LOW (ref 250–450)
UIBC: 204 ug/dL

## 2024-02-19 LAB — FOLATE: Folate: 9.9 ng/mL

## 2024-02-19 LAB — VITAMIN B12: Vitamin B-12: 1264 pg/mL — ABNORMAL HIGH (ref 180–914)

## 2024-02-19 NOTE — Progress Notes (Signed)
 " [facility]  [phone] [phone]  ID: Katie Conway OB: March 25, 1940  MR#: [id number]  RDW#:[id number]  Patient Care Team: Zachary Idelia LABOR, MD as PCP - General (Family Medicine)  REFERRING PROVIDER: Dr. Marcelino  REASON FOR REFERRAL: Anemia  HPI: Katie Conway is a 84 y.o. female with past medical history of COPD, GERD, hypertension, osteoporosis, CKD stage IV, chronic anemia, arthritis was referred to hematology for workup of anemia.  Patient follows with Dr. Marcelino for chronic kidney disease.  She has been anemic since 2017 and recently it has been progressing ranging around 10.  Occasionally takes oral iron because it makes her constipated.  Her energy levels are low which is chronic.  Gets shortness of breath on exertion secondary to her COPD.  Patient has not seen her pulmonary MD in a while she reports it is hard to get there she agrees to a referral closer to home.  I have put order in today.  REVIEW OF SYSTEMS:   Review of Systems  Constitutional:  Negative for chills, fever, malaise/fatigue and weight loss.  HENT:  Negative for sore throat.   Eyes:  Negative for blurred vision.  Respiratory:  Positive for shortness of breath. Negative for cough, wheezing and stridor.   Cardiovascular:  Negative for chest pain and palpitations.  Gastrointestinal:  Negative for heartburn and nausea.  Genitourinary:  Negative for dysuria and urgency.  Musculoskeletal:  Negative for myalgias.  Skin:  Negative for rash.  Neurological:  Negative for dizziness and headaches.    As per HPI. Otherwise, a complete review of systems is negative.  PAST MEDICAL HISTORY: Past Medical History:  Diagnosis Date   Arthritis    Knees   Chronic bronchitis (HCC)    COPD (chronic obstructive pulmonary disease) (HCC)    Diverticulosis    GERD (gastroesophageal reflux disease)    Hypertension    Osteoporosis    Renal insufficiency    Shortness of  breath dyspnea    with exertion   Wears dentures    Full - upper.  Partial - Lower   Wears dentures full upper and partial lower    PAST SURGICAL HISTORY: Past Surgical History:  Procedure Laterality Date   CATARACT EXTRACTION W/PHACO Left 06/02/2014   Procedure: CATARACT EXTRACTION PHACO AND INTRAOCULAR LENS PLACEMENT (IOC);  Surgeon: Dene Etienne, MD;  Location: [facility];  Service: Ophthalmology;  Laterality: Left;   CATARACT EXTRACTION W/PHACO Right 02/16/2015   Procedure: CATARACT EXTRACTION PHACO AND INTRAOCULAR LENS PLACEMENT (IOC);  Surgeon: Dene Etienne, MD;  Location: [facility];  Service: Ophthalmology;  Laterality: Right;   CESAREAN SECTION      FAMILY HISTORY: Family History  Problem Relation Age of Onset   Heart failure Mother    Heart attack Father    COPD Brother    COPD Brother    Kidney failure Brother    Breast cancer Other 30    HEALTH MAINTENANCE: Social History   Tobacco Use   Smoking status: Former    Current packs/day: 0.00    Types: Cigarettes    Quit date: 01/23/2011    Years since quitting: 13.0   Smokeless tobacco: Never   Tobacco comments:    Previous smoker; 1 pack a day for 50 years...  Vaping Use   Vaping status: Never Used  Substance Use Topics   Alcohol use: No    Comment: glass wine 2-3 times per year   Drug use: No  Allergies  Allergen Reactions   Amoxicillin Other (See Comments)    Hair loss Hair loss   Aspirin  Other (See Comments)    GI Upset   Lisinopril Other (See Comments)    Patient states this medication made her very weak.   Amlodipine Rash and Other (See Comments)    Fatigue     Current Outpatient Medications  Medication Sig Dispense Refill   albuterol  (PROVENTIL  HFA;VENTOLIN  HFA) 108 (90 Base) MCG/ACT inhaler Inhale into the lungs.     allopurinol (ZYLOPRIM) 100 MG tablet Take by mouth.     BREZTRI AEROSPHERE 160-9-4.8 MCG/ACT AERO inhaler Inhale into the lungs.      calcitRIOL (ROCALTROL) 0.25 MCG capsule Take by mouth.     famotidine (PEPCID) 20 MG tablet Take 1 tablet by mouth 2 (two) times daily.     FEROSUL 325 (65 Fe) MG tablet Take by mouth.     fluticasone-salmeterol (ADVAIR) 100-50 MCG/ACT AEPB Inhale 1 puff into the lungs 2 (two) times daily.     hydrALAZINE (APRESOLINE) 25 MG tablet Take 25 mg by mouth in the morning and at bedtime.     hydrochlorothiazide  (HYDRODIURIL ) 12.5 MG tablet TAKE 1 TABLET BY MOUTH ONCE DAILY 90 tablet 3   ipratropium-albuterol  (DUONEB) 0.5-2.5 (3) MG/3ML SOLN Take 3 mLs by nebulization 4 (four) times daily as needed (for wheezing/shortness of breath.).      losartan  (COZAAR ) 25 MG tablet Take 1 tablet (25 mg total) by mouth daily. 90 tablet 3   magnesium oxide (MAG-OX) 400 MG tablet Take 400 mg by mouth.     metoprolol  succinate (TOPROL -XL) 25 MG 24 hr tablet TAKE ONE TABLET BY MOUTH ONCE DAILY     mirtazapine (REMERON) 15 MG tablet Take 15 mg by mouth at bedtime.     montelukast (SINGULAIR) 10 MG tablet Take 1 tablet by mouth daily.     azithromycin (ZITHROMAX) 250 MG tablet Take 250 mg by mouth daily. (Patient not taking: Reported on 02/19/2024)     benzonatate (TESSALON) 200 MG capsule Take 200 mg by mouth 3 (three) times daily as needed for cough. For up to 7 days. (Patient not taking: Reported on 02/19/2024)     Calcium Carb-Cholecalciferol 600-10 MG-MCG TABS Take by mouth. (Patient not taking: Reported on 02/19/2024)     fluticasone (FLONASE) 50 MCG/ACT nasal spray Place into the nose. (Patient not taking: Reported on 02/19/2024)     montelukast (SINGULAIR) 10 MG tablet Take 10 mg by mouth at bedtime.     Omega-3 Fatty Acids (FISH OIL) 1000 MG CAPS Take 1,000 mg by mouth daily. (Patient not taking: Reported on 02/19/2024)     predniSONE  (DELTASONE ) 5 MG tablet Take 1 tablet (5 mg total) by mouth daily with breakfast. Restart after finishing 4 days course of prednisone  (Patient not taking: Reported on 02/19/2024)      TRELEGY ELLIPTA 100-62.5-25 MCG/ACT AEPB Inhale 1 puff into the lungs daily. (Patient not taking: Reported on 02/19/2024)     No current facility-administered medications for this visit.    OBJECTIVE: Vitals:   02/19/24 1124  BP: 139/85  Pulse: 92  Resp: 20  Temp: 98.4 F (36.9 C)  SpO2: 97%     Body mass index is 19.79 kg/m.      General: Well-developed, well-nourished, no acute distress. Eyes: Pink conjunctiva, anicteric sclera. HEENT: Normocephalic, moist mucous membranes, clear oropharnyx. Lungs: Clear to auscultation bilaterally. Heart: Regular rate and rhythm. No rubs, murmurs, or gallops. Abdomen: Soft, nontender, nondistended. No  organomegaly noted, normoactive bowel sounds. Musculoskeletal: No edema, cyanosis, or clubbing. Neuro: Alert, answering all questions appropriately. Cranial nerves grossly intact. Skin: No rashes or petechiae noted. Psych: Normal affect. Lymphatics: No cervical, calvicular, axillary or inguinal LAD.   LAB RESULTS:  Lab Results  Component Value Date   NA 139 03/14/2021   K 4.8 03/14/2021   CL 107 03/14/2021   CO2 21 (L) 03/14/2021   GLUCOSE 104 (H) 03/14/2021   BUN 43 (H) 03/14/2021   CREATININE 1.98 (H) 03/14/2021   CALCIUM 9.5 03/14/2021   PROT 7.5 03/14/2021   ALBUMIN 4.0 03/14/2021   AST 20 03/14/2021   ALT 13 03/14/2021   ALKPHOS 75 03/14/2021   BILITOT 0.7 03/14/2021   GFRNONAA 25 (L) 03/14/2021   GFRAA 32 (L) 04/05/2016    Lab Results  Component Value Date   WBC 9.4 02/19/2024   NEUTROABS 7.1 02/19/2024   HGB 10.0 (L) 02/19/2024   HCT 32.0 (L) 02/19/2024   MCV 85.3 02/19/2024   PLT 233 02/19/2024    Lab Results  Component Value Date   TIBC 249 (L) 02/19/2024   TIBC 276 04/24/2022   FERRITIN 280 (H) 04/10/2016   IRONPCTSAT 18 02/19/2024   IRONPCTSAT 21 04/24/2022   Lab Results  Component Value Date   IRON 46 02/19/2024   TIBC 249 (L) 02/19/2024   FERRITIN 280 (H) 04/10/2016    Lab Results  Component  Value Date   VITAMINB12 1,264 (H) 02/19/2024    Lab Results  Component Value Date   FOLATE 9.9 02/19/2024     STUDIES: No results found.  ASSESSMENT AND PLAN:   Katie Conway is a 84 y.o. female with pmh of COPD, GERD, hypertension, osteoporosis, CKD stage IV, chronic anemia, arthritis was referred to hematology for workup of anemia.  # Normocytic anemia # CKD stage IV -Progressive.  She has been anemic since 2017.  Today Hg 10  -Labs reviewed from 02/19/24.  CBC showed hemoglobin of 10.  Ferritin 249, Iron 46, iron saturation of 18  Her anemia is likely secondary to chronic kidney disease.  Vitamin b12 1264 with a folate of 9.9.  We discussed about Retacrit injections to help with the anemia.  Potential side effects such as increased risk of thromboembolic events, cardiovascular events and stroke with hemoglobin more than 11 was discussed.  Can also cause elevation in blood pressure.  Goal hemoglobin between 9-10.  Currently she is maintaining her hemoglobin around 10.  I would like to hold off on EPO injections.  I will follow-up with her again in 1 month for monitoring and possible epo injections.    Orders Placed This Encounter  Procedures   Ambulatory referral to Pulmonology    Labs today. Referral to pulmonology  F/U in 1 month see NP/MD with [facility]    Patient expressed understanding and was in agreement with this plan. She also understands that She can call clinic at any time with any questions, concerns, or complaints.     Morna Husband, NP   02/19/24 4:58 PM     "

## 2024-02-24 ENCOUNTER — Encounter: Payer: Self-pay | Admitting: Internal Medicine

## 2024-03-10 ENCOUNTER — Ambulatory Visit: Admitting: Pulmonary Disease

## 2024-03-23 ENCOUNTER — Inpatient Hospital Stay

## 2024-03-23 ENCOUNTER — Inpatient Hospital Stay: Admitting: Nurse Practitioner
